# Patient Record
Sex: Male | Born: 1945 | Race: White | Hispanic: No | Marital: Married | Smoking: Former smoker
Health system: Southern US, Community
[De-identification: ages and names within clinical notes are randomized; demographics above are authoritative.]

## PROBLEM LIST (undated history)

## (undated) DIAGNOSIS — G7000 Myasthenia gravis without (acute) exacerbation: Secondary | ICD-10-CM

## (undated) DIAGNOSIS — E782 Mixed hyperlipidemia: Secondary | ICD-10-CM

## (undated) DIAGNOSIS — K219 Gastro-esophageal reflux disease without esophagitis: Secondary | ICD-10-CM

## (undated) DIAGNOSIS — I10 Essential (primary) hypertension: Secondary | ICD-10-CM

## (undated) DIAGNOSIS — C4490 Unspecified malignant neoplasm of skin, unspecified: Secondary | ICD-10-CM

## (undated) DIAGNOSIS — C649 Malignant neoplasm of unspecified kidney, except renal pelvis: Secondary | ICD-10-CM

## (undated) DIAGNOSIS — N529 Male erectile dysfunction, unspecified: Secondary | ICD-10-CM

## (undated) DIAGNOSIS — E119 Type 2 diabetes mellitus without complications: Secondary | ICD-10-CM

## (undated) HISTORY — PX: THYROIDECTOMY: SHX17

## (undated) HISTORY — PX: APPENDECTOMY: SHX54

## (undated) HISTORY — PX: NEPHRECTOMY: SHX65

## (undated) HISTORY — PX: PANCREAS SURGERY: SHX731

## (undated) HISTORY — PX: CHOLECYSTECTOMY: SHX55

## (undated) HISTORY — PX: SKIN CANCER EXCISION: SHX779

---

## 1997-09-20 ENCOUNTER — Emergency Department (HOSPITAL_COMMUNITY): Admission: EM | Admit: 1997-09-20 | Discharge: 1997-09-20 | Payer: Self-pay | Admitting: Emergency Medicine

## 1997-09-25 ENCOUNTER — Emergency Department (HOSPITAL_COMMUNITY): Admission: EM | Admit: 1997-09-25 | Discharge: 1997-09-25 | Payer: Self-pay | Admitting: Emergency Medicine

## 1998-11-20 ENCOUNTER — Encounter: Payer: Self-pay | Admitting: Orthopedic Surgery

## 1998-11-26 ENCOUNTER — Ambulatory Visit (HOSPITAL_COMMUNITY): Admission: RE | Admit: 1998-11-26 | Discharge: 1998-11-26 | Payer: Self-pay | Admitting: Orthopedic Surgery

## 1999-02-08 ENCOUNTER — Ambulatory Visit (HOSPITAL_COMMUNITY): Admission: RE | Admit: 1999-02-08 | Discharge: 1999-02-08 | Payer: Self-pay | Admitting: Orthopedic Surgery

## 1999-02-08 ENCOUNTER — Encounter: Payer: Self-pay | Admitting: Orthopedic Surgery

## 1999-05-14 ENCOUNTER — Ambulatory Visit (HOSPITAL_COMMUNITY): Admission: RE | Admit: 1999-05-14 | Discharge: 1999-05-14 | Payer: Self-pay

## 2000-07-23 ENCOUNTER — Encounter: Payer: Self-pay | Admitting: *Deleted

## 2000-07-23 ENCOUNTER — Inpatient Hospital Stay (HOSPITAL_COMMUNITY): Admission: EM | Admit: 2000-07-23 | Discharge: 2000-07-24 | Payer: Self-pay | Admitting: *Deleted

## 2003-10-20 ENCOUNTER — Observation Stay (HOSPITAL_COMMUNITY): Admission: EM | Admit: 2003-10-20 | Discharge: 2003-10-21 | Payer: Self-pay | Admitting: Emergency Medicine

## 2005-09-24 ENCOUNTER — Ambulatory Visit (HOSPITAL_BASED_OUTPATIENT_CLINIC_OR_DEPARTMENT_OTHER): Admission: RE | Admit: 2005-09-24 | Discharge: 2005-09-24 | Payer: Self-pay | Admitting: Orthopedic Surgery

## 2014-07-20 ENCOUNTER — Encounter (HOSPITAL_COMMUNITY): Payer: Self-pay | Admitting: Emergency Medicine

## 2014-07-20 ENCOUNTER — Emergency Department (HOSPITAL_COMMUNITY): Payer: PPO

## 2014-07-20 ENCOUNTER — Inpatient Hospital Stay (HOSPITAL_COMMUNITY)
Admission: EM | Admit: 2014-07-20 | Discharge: 2014-07-21 | DRG: 313 | Disposition: A | Discharge status: Home / Self Care | Payer: PPO | Attending: Internal Medicine | Admitting: Internal Medicine

## 2014-07-20 DIAGNOSIS — Z905 Acquired absence of kidney: Secondary | ICD-10-CM | POA: Diagnosis present

## 2014-07-20 DIAGNOSIS — R079 Chest pain, unspecified: Principal | ICD-10-CM | POA: Diagnosis present

## 2014-07-20 DIAGNOSIS — K219 Gastro-esophageal reflux disease without esophagitis: Secondary | ICD-10-CM | POA: Diagnosis present

## 2014-07-20 DIAGNOSIS — K2100 Gastro-esophageal reflux disease with esophagitis, without bleeding: Secondary | ICD-10-CM

## 2014-07-20 DIAGNOSIS — E119 Type 2 diabetes mellitus without complications: Secondary | ICD-10-CM | POA: Diagnosis present

## 2014-07-20 DIAGNOSIS — I129 Hypertensive chronic kidney disease with stage 1 through stage 4 chronic kidney disease, or unspecified chronic kidney disease: Secondary | ICD-10-CM | POA: Diagnosis present

## 2014-07-20 DIAGNOSIS — Z85528 Personal history of other malignant neoplasm of kidney: Secondary | ICD-10-CM | POA: Diagnosis not present

## 2014-07-20 DIAGNOSIS — I10 Essential (primary) hypertension: Secondary | ICD-10-CM | POA: Diagnosis not present

## 2014-07-20 DIAGNOSIS — R0789 Other chest pain: Secondary | ICD-10-CM | POA: Diagnosis not present

## 2014-07-20 DIAGNOSIS — E782 Mixed hyperlipidemia: Secondary | ICD-10-CM | POA: Diagnosis present

## 2014-07-20 DIAGNOSIS — N182 Chronic kidney disease, stage 2 (mild): Secondary | ICD-10-CM | POA: Diagnosis present

## 2014-07-20 DIAGNOSIS — M1990 Unspecified osteoarthritis, unspecified site: Secondary | ICD-10-CM | POA: Diagnosis present

## 2014-07-20 DIAGNOSIS — Z85828 Personal history of other malignant neoplasm of skin: Secondary | ICD-10-CM

## 2014-07-20 DIAGNOSIS — N179 Acute kidney failure, unspecified: Secondary | ICD-10-CM | POA: Diagnosis present

## 2014-07-20 DIAGNOSIS — Z87891 Personal history of nicotine dependence: Secondary | ICD-10-CM

## 2014-07-20 DIAGNOSIS — Z9049 Acquired absence of other specified parts of digestive tract: Secondary | ICD-10-CM | POA: Diagnosis present

## 2014-07-20 HISTORY — DX: Male erectile dysfunction, unspecified: N52.9

## 2014-07-20 HISTORY — DX: Unspecified malignant neoplasm of skin, unspecified: C44.90

## 2014-07-20 HISTORY — DX: Essential (primary) hypertension: I10

## 2014-07-20 HISTORY — DX: Gastro-esophageal reflux disease without esophagitis: K21.9

## 2014-07-20 HISTORY — DX: Malignant neoplasm of unspecified kidney, except renal pelvis: C64.9

## 2014-07-20 HISTORY — DX: Mixed hyperlipidemia: E78.2

## 2014-07-20 HISTORY — DX: Type 2 diabetes mellitus without complications: E11.9

## 2014-07-20 LAB — CBC WITH DIFFERENTIAL/PLATELET
BASOS ABS: 0.1 10*3/uL (ref 0.0–0.1)
BASOS PCT: 1 % (ref 0–1)
EOS ABS: 0.4 10*3/uL (ref 0.0–0.7)
Eosinophils Relative: 5 % (ref 0–5)
HCT: 40.1 % (ref 39.0–52.0)
Hemoglobin: 13.8 g/dL (ref 13.0–17.0)
LYMPHS ABS: 1.5 10*3/uL (ref 0.7–4.0)
Lymphocytes Relative: 24 % (ref 12–46)
MCH: 28.2 pg (ref 26.0–34.0)
MCHC: 34.4 g/dL (ref 30.0–36.0)
MCV: 82 fL (ref 78.0–100.0)
MONO ABS: 0.6 10*3/uL (ref 0.1–1.0)
MONOS PCT: 8 % (ref 3–12)
NEUTROS ABS: 4.1 10*3/uL (ref 1.7–7.7)
Neutrophils Relative %: 62 % (ref 43–77)
Platelets: 214 10*3/uL (ref 150–400)
RBC: 4.89 MIL/uL (ref 4.22–5.81)
RDW: 13.5 % (ref 11.5–15.5)
WBC: 6.5 10*3/uL (ref 4.0–10.5)

## 2014-07-20 LAB — COMPREHENSIVE METABOLIC PANEL
ALBUMIN: 4.4 g/dL (ref 3.5–5.0)
ALT: 29 U/L (ref 17–63)
AST: 32 U/L (ref 15–41)
Alkaline Phosphatase: 68 U/L (ref 38–126)
Anion gap: 12 (ref 5–15)
BILIRUBIN TOTAL: 0.5 mg/dL (ref 0.3–1.2)
BUN: 22 mg/dL — ABNORMAL HIGH (ref 6–20)
CHLORIDE: 103 mmol/L (ref 101–111)
CO2: 23 mmol/L (ref 22–32)
CREATININE: 1.39 mg/dL — AB (ref 0.61–1.24)
Calcium: 10 mg/dL (ref 8.9–10.3)
GFR calc Af Amer: 59 mL/min — ABNORMAL LOW (ref >60)
GFR calc non Af Amer: 51 mL/min — ABNORMAL LOW (ref >60)
GLUCOSE: 150 mg/dL — AB (ref 65–99)
Potassium: 4.3 mmol/L (ref 3.5–5.1)
Sodium: 138 mmol/L (ref 135–145)
TOTAL PROTEIN: 7.2 g/dL (ref 6.5–8.1)

## 2014-07-20 LAB — GLUCOSE, CAPILLARY: Glucose-Capillary: 128 mg/dL — ABNORMAL HIGH (ref 65–99)

## 2014-07-20 LAB — TROPONIN I
Troponin I: <0.03 ng/mL (ref <0.031)
Troponin I: <0.03 ng/mL (ref <0.031)

## 2014-07-20 LAB — LIPASE, BLOOD: Lipase: 31 U/L (ref 22–51)

## 2014-07-20 MED ORDER — ZOLPIDEM TARTRATE 5 MG PO TABS: 5.0000 mg | ORAL_TABLET | Freq: Every evening | ORAL | Status: DC | PRN

## 2014-07-20 MED ORDER — SODIUM CHLORIDE 0.9 % IJ SOLN
3.0000 mL | Freq: Two times a day (BID) | INTRAMUSCULAR | Status: DC
  Administered 2014-07-20: 3 mL via INTRAVENOUS

## 2014-07-20 MED ORDER — ASPIRIN EC 325 MG PO TBEC
325.0000 mg | DELAYED_RELEASE_TABLET | Freq: Every day | ORAL | Status: DC
  Administered 2014-07-21: 325 mg via ORAL
  Filled 2014-07-20: qty 1

## 2014-07-20 MED ORDER — ALUM & MAG HYDROXIDE-SIMETH 200-200-20 MG/5ML PO SUSP: 30.0000 mL | Freq: Four times a day (QID) | ORAL | Status: DC | PRN

## 2014-07-20 MED ORDER — PANTOPRAZOLE SODIUM 40 MG PO TBEC
40.0000 mg | DELAYED_RELEASE_TABLET | Freq: Every day | ORAL | Status: DC
  Administered 2014-07-21: 40 mg via ORAL
  Filled 2014-07-20 (×2): qty 1

## 2014-07-20 MED ORDER — INSULIN ASPART 100 UNIT/ML ~~LOC~~ SOLN: 0.0000 [IU] | Freq: Three times a day (TID) | SUBCUTANEOUS | Status: DC

## 2014-07-20 MED ORDER — SODIUM CHLORIDE 0.9 % IV SOLN
INTRAVENOUS | Status: DC
  Administered 2014-07-20: 23:00:00 via INTRAVENOUS
  Administered 2014-07-21: 100 mL/h via INTRAVENOUS

## 2014-07-20 MED ORDER — HEPARIN SODIUM (PORCINE) 5000 UNIT/ML IJ SOLN
5000.0000 [IU] | Freq: Three times a day (TID) | INTRAMUSCULAR | Status: DC
  Filled 2014-07-20 (×3): qty 1

## 2014-07-20 MED ORDER — NITROGLYCERIN 0.4 MG SL SUBL: 0.4000 mg | SUBLINGUAL_TABLET | SUBLINGUAL | Status: DC | PRN

## 2014-07-20 MED ORDER — MORPHINE SULFATE 2 MG/ML IJ SOLN: 2.0000 mg | INTRAMUSCULAR | Status: DC | PRN

## 2014-07-20 MED ORDER — ACETAMINOPHEN 325 MG PO TABS: 650.0000 mg | ORAL_TABLET | Freq: Four times a day (QID) | ORAL | Status: DC | PRN

## 2014-07-20 MED ORDER — ONDANSETRON HCL 4 MG PO TABS: 4.0000 mg | ORAL_TABLET | Freq: Four times a day (QID) | ORAL | Status: DC | PRN

## 2014-07-20 MED ORDER — ONDANSETRON HCL 4 MG/2ML IJ SOLN: 4.0000 mg | Freq: Four times a day (QID) | INTRAMUSCULAR | Status: DC | PRN

## 2014-07-20 MED ORDER — ASPIRIN 325 MG PO TABS: 325.0000 mg | ORAL_TABLET | Freq: Every day | ORAL | Status: DC

## 2014-07-20 MED ORDER — ATENOLOL 12.5 MG HALF TABLET
12.5000 mg | ORAL_TABLET | Freq: Two times a day (BID) | ORAL | Status: DC
  Administered 2014-07-20 – 2014-07-21 (×2): 12.5 mg via ORAL
  Filled 2014-07-20 (×3): qty 1

## 2014-07-20 MED ORDER — ATORVASTATIN CALCIUM 40 MG PO TABS
40.0000 mg | ORAL_TABLET | Freq: Every day | ORAL | Status: DC
  Filled 2014-07-20: qty 1

## 2014-07-20 MED ORDER — ACETAMINOPHEN 650 MG RE SUPP: 650.0000 mg | Freq: Four times a day (QID) | RECTAL | Status: DC | PRN

## 2014-07-20 NOTE — ED Notes (Signed)
Pt. Complaint of CP starting last PM. Central chest pressure, no radiation, some diaphoresis. Pt. States he took two tums and was able to sleep. Pt. Had 1/10 pain this AM. Went to [facility]. Pt. Had 325 ASA and 1 Nitro today. Denies CP/SOB at this time. Pt. States he has had increased weakness x the last couple months.

## 2014-07-20 NOTE — H&P (Signed)
[facility] History and Physical  Kevin Hawkins LKG:[id number] DOB: Apr 13, 1945 DOA: 07/20/2014  Referring physician: ED physician PCP: Leonides Sake, MD  Specialists:   Chief Complaint: Chest pain  HPI: Kevin Hawkins is a 69 y.o. male with PMH of hypertension, hyperlipidemia, diabetes mellitus, GERD, left kidney cancer (post status of nephrectomy 6 years ago, no chemotherapy or radiation therapy), who presents with chest pain.  Patient reports that he had sudden onset chest pain at about 7 PM. It was located in the substernal area, sharp, 10 out of 10 in severity, nonradiating. It was not aggravated by deep breath. It was associated nausea, diaphoresis, shortness of breath. It lasted for about 30 min and resolved spontaneously. Patient was seen at urgent care and EKG obtained. They were concerned for possible ST elevations, however there is no evidence of ST elevation on the urgent care EKG per ED. When I saw patient in Ed, he is chest pain free. He states that sometimes he has mild cough, no SOB.   Currently patient denies fever, chills, fatigue, running nose, ear pain, headaches, chest pain, SOB, abdominal pain, diarrhea, constipation, dysuria, urgency, frequency, hematuria, skin rashes, joint pain or leg swelling. No unilateral weakness, numbness or tingling sensations. No vision change or hearing loss.  In ED, patient was found to have negative troponin, temperature normal, no tachycardia, WBC 6.5, electrolytes okay, lipase 31, negative chest x-ray. EKG showed occasional PVC, no ischemic change. Patient isn't admitted to inpatient for further evaluation and treatment.  Where does patient live?   At home   Can patient participate in ADLs?  Yes    Review of Systems:   General: no fevers, chills, no changes in body weight, normal appetite, no fatigue HEENT: no blurry vision, hearing changes or sore throat Pulm: no dyspnea, has mild coughing sometimes, wheezing CV: no chest  pain, palpitations Abd: no nausea, vomiting, abdominal pain, diarrhea, constipation GU: no dysuria, burning on urination, increased urinary frequency, hematuria  Ext: no leg edema Neuro: no unilateral weakness, numbness, or tingling, no vision change or hearing loss Skin: no rash MSK: No muscle spasm, no deformity, no limitation of range of movement in spin Heme: No easy bruising.  Travel history: No recent long distant travel.  Allergy: No Known Allergies  Past Medical History  Diagnosis Date  . Hypertension   . GERD (gastroesophageal reflux disease)   . Hyperlipidemia, mixed   . ED (erectile dysfunction)   . Tobacco abuse   . Type II diabetes mellitus   . Arthritis     "all over"  . Skin cancer     under right eye"  . Renal cancer     S/P left nephrectomy; no chemo or radiation    Past Surgical History  Procedure Laterality Date  . Cholecystectomy    . Appendectomy    . Nephrectomy Left ~ 2010  . Pancreas surgery      "I've got 2/3 of it left"  . Skin cancer excision Right     "under eye"    Social History:  reports that he has quit smoking. His smoking use included Cigarettes. He has a 40 pack-year smoking history. He has never used smokeless tobacco. He reports that he drinks alcohol. He reports that he does not use illicit drugs.  Family History:  Family History  Problem Relation Age of Onset  . Hypertension Mother   . Heart attack Mother   . Cancer Father     Possible bone cancer  .  Diabetes Sister   . Prostate cancer Brother      Prior to Admission medications   Not on File    Physical Exam: Filed Vitals:   07/20/14 2045 07/20/14 2100 07/20/14 2115 07/20/14 2213  BP: 134/64 144/73 141/61 126/68  Pulse: 75 75 72 71  Temp:    97.9 F (36.6 C)  TempSrc:    Oral  Resp: '22 20 18 18  '$ Height:    6' (1.829 m)  Weight:    75.4 kg (166 lb 3.6 oz)  SpO2: 97% 97% 96% 98%   General: Not in acute distress HEENT:       Eyes: PERRL, EOMI, no scleral  icterus.       ENT: No discharge from the ears and nose, no pharynx injection, no tonsillar enlargement.        Neck: No JVD, no bruit, no mass felt. Heme: No neck lymph node enlargement. Cardiac: S1/S2, RRR, No murmurs, No gallops or rubs. Pulm: Good air movement bilaterally. No rales, wheezing, rhonchi or rubs. Abd: Soft, nondistended, nontender, no rebound pain, no organomegaly, BS present. Ext: No pitting leg edema bilaterally. 2+DP/PT pulse bilaterally. Musculoskeletal: No joint deformities, No joint redness or warmth, no limitation of ROM in spin. Skin: No rashes.  Neuro: Alert, oriented X3, cranial nerves II-XII grossly intact, muscle strength 5/5 in all extremities, sensation to light touch intact.  Psych: Patient is not psychotic, no suicidal or hemocidal ideation.  Labs on Admission:  Basic Metabolic Panel:  Recent Labs Lab 07/20/14 1754  NA 138  K 4.3  CL 103  CO2 23  GLUCOSE 150*  BUN 22*  CREATININE 1.39*  CALCIUM 10.0   Liver Function Tests:  Recent Labs Lab 07/20/14 1754  AST 32  ALT 29  ALKPHOS 68  BILITOT 0.5  PROT 7.2  ALBUMIN 4.4    Recent Labs Lab 07/20/14 1754  LIPASE 31   No results for input(s): AMMONIA in the last 168 hours. CBC:  Recent Labs Lab 07/20/14 1754  WBC 6.5  NEUTROABS 4.1  HGB 13.8  HCT 40.1  MCV 82.0  PLT 214   Cardiac Enzymes:  Recent Labs Lab 07/20/14 1754 07/20/14 2225  TROPONINI <0.03 <0.03    BNP (last 3 results) No results for input(s): BNP in the last 8760 hours.  ProBNP (last 3 results) No results for input(s): PROBNP in the last 8760 hours.  CBG:  Recent Labs Lab 07/20/14 2301  GLUCAP 128*    Radiological Exams on Admission: Dg Chest 2 View  07/20/2014   CLINICAL DATA:  Chest pain.  Cough.  EXAM: CHEST  2 VIEW  COMPARISON:  05/31/2014  FINDINGS: Heart size and pulmonary vascularity are normal. No infiltrates or effusions. Chronic slight pleural thickening laterally at the lung bases.  No  acute osseous abnormality.  Previous right rotator cuff surgery.  IMPRESSION: No acute abnormality.   Electronically Signed   By: Lorriane Shire M.D.   On: 07/20/2014 17:37    EKG: Independently reviewed.  Abnormal findings: PVC   Assessment/Plan Principal Problem:   Chest pain Active Problems:   Hypertension   GERD (gastroesophageal reflux disease)   Hyperlipidemia, mixed   Diabetes mellitus without complication   AKI (acute kidney injury)  Chest pain: Atypical chest pain. Currently chest pain-free. No pneumonia by x-ray. Less likely to have pulmonary embolism given no oxygen desaturation, SOB and low Well's score.  - will admit to Tele bed  - cycle CE q6 x3 and repeat her  EKG in the am  - prn Nitroglycerin, Morphine, and aspirin, lipitor, atenolol - Risk factor stratification: will check FLP and A1C, UDS - Consider cardiology consult if test positive for CEs  - 2d echo  GERD: -Protonix  Hypertension: -Atenolol and amlodipine -hold cozaar due to AKI  HLD: Hyperlipidemia, mixed. No LDL record. -Lipitor and fenofibrate -FLP  DM-II: No A1c on recored. Patient is taking metformin at home -SSI -Check A1c  AKI: Cre=1.39. Bun=22. No record for previous Cre.  likely due to prerenal and continuation of ARB -IVF:100 cc/h -Check FeNa -US-renal -Follow up renal function by BMP -Hold cozaar   DVT ppx: SQ Heparin     Code Status: Full code Family Communication:   Yes, patient's  Daughter and wife     at bed side Disposition Plan: Admit to inpatient   Date of Service 07/21/2014    Ivor Costa [facility] Pager [phone]  If 7PM-7AM, please contact night-coverage [email or web address] Password [email or web address] 07/21/2014, 2:00 AM

## 2014-07-20 NOTE — ED Provider Notes (Signed)
CSN: [id number]     Arrival date & time 07/20/14  1633 History   First MD Initiated Contact with Patient 07/20/14 1645     Chief Complaint  Patient presents with  . Chest Pain     (Consider location/radiation/quality/duration/timing/severity/associated sxs/prior Treatment) Patient is a 69 y.o. male presenting with chest pain.  Chest Pain Pain location:  Substernal area Pain quality: pressure   Pain radiates to:  Does not radiate Pain radiates to the back: no   Pain severity:  Severe Onset quality:  Sudden (this occurred yesterday at approximately in 1900 in the evening. Patient reported that  after the pain had subsided he was able to follow asleep. This morning when he awoke he still had mild pain and was urged by his family to be evaluated.) Duration:  30 minutes Timing:  Rare Progression:  Resolved Chronicity:  New Context: at rest   Context: not breathing, no drug use, not eating, no intercourse, not lifting, no movement, not raising an arm, no stress and no trauma   Relieved by:  Nitroglycerin Worsened by:  Nothing tried Associated symptoms: diaphoresis, fatigue, nausea and shortness of breath   Associated symptoms: no abdominal pain, no altered mental status, no anorexia, no anxiety, no back pain, no claudication, no cough, no dizziness, no fever, no headache, no heartburn, no lower extremity edema, no numbness, no orthopnea, no palpitations, no PND, no syncope, not vomiting and no weakness   Risk factors: diabetes mellitus, high cholesterol, hypertension and male sex   Risk factors: no smoking     Past Medical History  Diagnosis Date  . Hypertension   . GERD (gastroesophageal reflux disease)   . Hyperlipidemia, mixed   . ED (erectile dysfunction)   . Tobacco abuse   . Type II diabetes mellitus   . Arthritis     "all over"  . Skin cancer     under right eye"  . Renal cancer     S/P left nephrectomy; no chemo or radiation   Past Surgical History  Procedure  Laterality Date  . Cholecystectomy    . Appendectomy    . Nephrectomy Left ~ 2010  . Pancreas surgery      "I've got 2/3 of it left"  . Skin cancer excision Right     "under eye"   History reviewed. No pertinent family history. History  Substance Use Topics  . Smoking status: Former Smoker -- 2.00 packs/day for 20 years    Types: Cigarettes  . Smokeless tobacco: Never Used     Comment: "stopped smoking cigarettes in the early 1970's"  . Alcohol Use: Yes     Comment: "stopped drinking in the early 1970's"    Review of Systems  Constitutional: Positive for diaphoresis and fatigue. Negative for fever, chills and appetite change.  HENT: Negative for congestion, ear pain, facial swelling, mouth sores and sore throat.   Eyes: Negative for visual disturbance.  Respiratory: Positive for shortness of breath. Negative for cough and chest tightness.   Cardiovascular: Positive for chest pain. Negative for palpitations, orthopnea, claudication, syncope and PND.  Gastrointestinal: Positive for nausea. Negative for heartburn, vomiting, abdominal pain, diarrhea, blood in stool and anorexia.  Endocrine: Negative for cold intolerance and heat intolerance.  Genitourinary: Negative for frequency, decreased urine volume and difficulty urinating.  Musculoskeletal: Negative for back pain and neck stiffness.  Skin: Negative for rash.  Neurological: Negative for dizziness, weakness, light-headedness, numbness and headaches.  All other systems reviewed and are negative.  Allergies  Review of patient's allergies indicates no known allergies.  Home Medications   Prior to Admission medications   Medication Sig Start Date End Date Taking? Authorizing Provider  amLODipine (NORVASC) 10 MG tablet Take 10 mg by mouth daily.   Yes Historical Provider, MD  aspirin 325 MG tablet Take 325 mg by mouth daily.   Yes Historical Provider, MD  fenofibrate micronized (LOFIBRA) 134 MG capsule Take 134 mg by mouth  daily before breakfast.   Yes Historical Provider, MD  losartan (COZAAR) 100 MG tablet Take 100 mg by mouth daily.   Yes Historical Provider, MD  lovastatin (MEVACOR) 20 MG tablet Take 20 mg by mouth at bedtime.   Yes Historical Provider, MD  metFORMIN (GLUCOPHAGE-XR) 500 MG 24 hr tablet Take 500-1,000 mg by mouth 2 (two) times daily. Take 1000 mg in the morning and 500 in the evening at bedtime   Yes Historical Provider, MD  omeprazole (PRILOSEC) 40 MG capsule Take 40 mg by mouth daily.   Yes Historical Provider, MD   BP 126/68 mmHg  Pulse 71  Temp(Src) 97.9 F (36.6 C) (Oral)  Resp 18  Ht 6' (1.829 m)  Wt 166 lb 3.6 oz (75.4 kg)  BMI 22.54 kg/m2  SpO2 98% Physical Exam  Constitutional: He is oriented to person, place, and time. He appears well-nourished. No distress.  HENT:  Head: Normocephalic and atraumatic.  Right Ear: External ear normal.  Left Ear: External ear normal.  Eyes: Pupils are equal, round, and reactive to light. Right eye exhibits no discharge. Left eye exhibits no discharge. No scleral icterus.  Neck: Normal range of motion. Neck supple.  Cardiovascular: Normal rate.  Exam reveals no gallop and no friction rub.   No murmur heard. Pulmonary/Chest: Effort normal and breath sounds normal. No stridor. No respiratory distress. He has no wheezes. He has no rales. He exhibits no tenderness.  Abdominal: Soft. He exhibits no distension and no mass. There is no tenderness. There is no rebound and no guarding.  Musculoskeletal: He exhibits no edema or tenderness.  Neurological: He is alert and oriented to person, place, and time.  Skin: Skin is warm and dry. No rash noted. He is not diaphoretic. No erythema.    ED Course  Procedures (including critical care time) Labs Review Labs Reviewed  COMPREHENSIVE METABOLIC PANEL - Abnormal; Notable for the following:    Glucose, Bld 150 (*)    BUN 22 (*)    Creatinine, Ser 1.39 (*)    GFR calc non Af Amer 51 (*)    GFR calc Af  Amer 59 (*)    All other components within normal limits  GLUCOSE, CAPILLARY - Abnormal; Notable for the following:    Glucose-Capillary 128 (*)    All other components within normal limits  CBC WITH DIFFERENTIAL/PLATELET  TROPONIN I  LIPASE, BLOOD  TROPONIN I  URINE RAPID DRUG SCREEN (HOSP PERFORMED) NOT AT [facility]  URINE RAPID DRUG SCREEN (HOSP PERFORMED) NOT AT [facility]  HIV ANTIBODY (ROUTINE TESTING)  TROPONIN I  TROPONIN I  HEMOGLOBIN A1C  PROTIME-INR  APTT  BASIC METABOLIC PANEL  CBC  LIPID PANEL    Imaging Review Dg Chest 2 View  07/20/2014   CLINICAL DATA:  Chest pain.  Cough.  EXAM: CHEST  2 VIEW  COMPARISON:  05/31/2014  FINDINGS: Heart size and pulmonary vascularity are normal. No infiltrates or effusions. Chronic slight pleural thickening laterally at the lung bases.  No acute osseous abnormality.  Previous right  rotator cuff surgery.  IMPRESSION: No acute abnormality.   Electronically Signed   By: Lorriane Shire M.D.   On: 07/20/2014 17:37     EKG Interpretation   Date/Time:  Friday July 20 2014 16:36:57 EDT Ventricular Rate:  72 PR Interval:  153 QRS Duration: 103 QT Interval:  418 QTC Calculation: 457 R Axis:   -38 Text Interpretation:  Sinus arrhythmia Ventricular premature complex Left  axis deviation RSR' in V1 or V2, probably normal variant No significant  change was found Confirmed by Wyvonnia Dusky  MD, STEPHEN [phone]) on 07/20/2014  4:47:19 PM      MDM   Final diagnoses:  Chest pain, unspecified chest pain type      69 year old gentleman with a past medical history of hypertension, diabetes, hyperlipidemia, and renal cancer who presents to the ED with chest pain onset last night around 1900 last approximately 30 minutes. Patient described the pain as pressure like non-radiating and did have associated nausea, diaphoresis, shortness of breath. Patient seen at urgent care and EKG obtained. They were concerned for possible ST elevations however on my assessment  there is no evidence of ST elevation on the urgent care EKG. EMS was called to the urgent care for transportation to the ED. She has remained hemodynamically stable and round. On arrival the patient's was hemodynamically stable, alert and oriented 3. Patient was given 324 of aspirin this morning and nitroglycerin by EMS. Patient is currently pain-free. EKG here revealed sinus arrhythmia with incomplete right bundle branch block. No evidence of acute ischemia. Chest x-ray with no evidence of pneumonia, pneumothorax, pneumomediastinum, pleural effusion, or widened mediastinum (to suggest dissection). There is low clinical suspicion for pulmonary embolism. Doubt esophageal rupture. Patient with a HEAR score of 6. Initial troponin negative. Patient will be admitted to medicine service for ACS rule out.  Patient seen in conjunction with Dr. Wyvonnia Dusky.  Sibyl Parr, MD. Resident  Addison Lank, MD 07/21/14 0104  Addison Lank, MD 07/21/14 6045  Ezequiel Essex, MD 07/21/14 [phone]

## 2014-07-21 ENCOUNTER — Encounter (HOSPITAL_COMMUNITY): Payer: Self-pay | Admitting: Internal Medicine

## 2014-07-21 ENCOUNTER — Observation Stay (HOSPITAL_COMMUNITY): Payer: PPO

## 2014-07-21 DIAGNOSIS — R0789 Other chest pain: Secondary | ICD-10-CM

## 2014-07-21 DIAGNOSIS — R079 Chest pain, unspecified: Secondary | ICD-10-CM

## 2014-07-21 DIAGNOSIS — N179 Acute kidney failure, unspecified: Secondary | ICD-10-CM | POA: Diagnosis present

## 2014-07-21 DIAGNOSIS — N182 Chronic kidney disease, stage 2 (mild): Secondary | ICD-10-CM

## 2014-07-21 DIAGNOSIS — E119 Type 2 diabetes mellitus without complications: Secondary | ICD-10-CM

## 2014-07-21 LAB — TROPONIN I
Troponin I: <0.03 ng/mL (ref <0.031)
Troponin I: <0.03 ng/mL (ref <0.031)

## 2014-07-21 LAB — PROTIME-INR
INR: 1.18 (ref 0.00–1.49)
PROTHROMBIN TIME: 15.2 s (ref 11.6–15.2)

## 2014-07-21 LAB — RAPID URINE DRUG SCREEN, HOSP PERFORMED
Amphetamines: NOT DETECTED
BARBITURATES: NOT DETECTED
Benzodiazepines: NOT DETECTED
Cocaine: NOT DETECTED
Opiates: NOT DETECTED
Tetrahydrocannabinol: NOT DETECTED

## 2014-07-21 LAB — NM MYOCAR MULTI W/SPECT W/WALL MOTION / EF
CHL CUP NUCLEAR SSS: 3
LV dias vol: 102 mL
LVSYSVOL: 50 mL
Nuc Stress EF: 50 %
RATE: 0.34
SDS: 0
SRS: 3
TID: 1.22

## 2014-07-21 LAB — CBC
HCT: 38.5 % — ABNORMAL LOW (ref 39.0–52.0)
Hemoglobin: 13.2 g/dL (ref 13.0–17.0)
MCH: 28.3 pg (ref 26.0–34.0)
MCHC: 34.3 g/dL (ref 30.0–36.0)
MCV: 82.6 fL (ref 78.0–100.0)
Platelets: 204 10*3/uL (ref 150–400)
RBC: 4.66 MIL/uL (ref 4.22–5.81)
RDW: 13.7 % (ref 11.5–15.5)
WBC: 6.3 10*3/uL (ref 4.0–10.5)

## 2014-07-21 LAB — GLUCOSE, CAPILLARY
GLUCOSE-CAPILLARY: 126 mg/dL — AB (ref 65–99)
GLUCOSE-CAPILLARY: 119 mg/dL — AB (ref 65–99)
GLUCOSE-CAPILLARY: 323 mg/dL — AB (ref 65–99)

## 2014-07-21 LAB — SODIUM, URINE, RANDOM: SODIUM UR: 123 mmol/L

## 2014-07-21 LAB — LIPID PANEL
CHOL/HDL RATIO: 4 ratio
Cholesterol: 169 mg/dL (ref 0–200)
HDL: 42 mg/dL (ref >40)
LDL CALC: 98 mg/dL (ref 0–99)
Triglycerides: 144 mg/dL (ref <150)
VLDL: 29 mg/dL (ref 0–40)

## 2014-07-21 LAB — BASIC METABOLIC PANEL
ANION GAP: 8 (ref 5–15)
BUN: 20 mg/dL (ref 6–20)
CO2: 24 mmol/L (ref 22–32)
CREATININE: 1.28 mg/dL — AB (ref 0.61–1.24)
Calcium: 9.2 mg/dL (ref 8.9–10.3)
Chloride: 105 mmol/L (ref 101–111)
GFR, EST NON AFRICAN AMERICAN: 56 mL/min — AB (ref >60)
Glucose, Bld: 150 mg/dL — ABNORMAL HIGH (ref 65–99)
Potassium: 4.1 mmol/L (ref 3.5–5.1)
Sodium: 137 mmol/L (ref 135–145)

## 2014-07-21 LAB — HIV ANTIBODY (ROUTINE TESTING W REFLEX): HIV SCREEN 4TH GENERATION: NONREACTIVE

## 2014-07-21 LAB — CREATININE, URINE, RANDOM: Creatinine, Urine: 150.54 mg/dL

## 2014-07-21 LAB — APTT: aPTT: 25 seconds (ref 24–37)

## 2014-07-21 MED ORDER — ATORVASTATIN CALCIUM 40 MG PO TABS: 40.0000 mg | ORAL_TABLET | Freq: Every day | ORAL | Status: DC

## 2014-07-21 MED ORDER — NITROGLYCERIN 0.4 MG SL SUBL: 0.4000 mg | SUBLINGUAL_TABLET | SUBLINGUAL | Status: AC | PRN

## 2014-07-21 MED ORDER — REGADENOSON 0.4 MG/5ML IV SOLN
INTRAVENOUS | Status: AC
  Administered 2014-07-21: 0.4 mg
  Filled 2014-07-21: qty 5

## 2014-07-21 MED ORDER — AMLODIPINE BESYLATE 5 MG PO TABS
5.0000 mg | ORAL_TABLET | Freq: Every day | ORAL | Status: DC
  Administered 2014-07-21: 5 mg via ORAL
  Filled 2014-07-21: qty 1

## 2014-07-21 MED ORDER — FENOFIBRATE 160 MG PO TABS
160.0000 mg | ORAL_TABLET | Freq: Every day | ORAL | Status: DC
  Administered 2014-07-21: 160 mg via ORAL
  Filled 2014-07-21: qty 1

## 2014-07-21 MED ORDER — TECHNETIUM TC 99M SESTAMIBI - CARDIOLITE
30.0000 | Freq: Once | INTRAVENOUS | Status: AC | PRN
  Administered 2014-07-21: 30 via INTRAVENOUS

## 2014-07-21 MED ORDER — TECHNETIUM TC 99M SESTAMIBI GENERIC - CARDIOLITE
10.0000 | Freq: Once | INTRAVENOUS | Status: AC | PRN
  Administered 2014-07-21: 10 via INTRAVENOUS

## 2014-07-21 NOTE — Progress Notes (Signed)
Echocardiogram 2D Echocardiogram has been performed.  Tresa Res 07/21/2014, 2:41 PM

## 2014-07-21 NOTE — Discharge Instructions (Signed)
Follow with HAMRICK,MAURA L, MD in 2-4 weeks  Please get a complete blood count and chemistry panel checked by your Primary MD at your next visit, and again as instructed by your Primary MD. Please get your medications reviewed and adjusted by your Primary MD.  Please request your Primary MD to go over all Hospital Tests and Procedure/Radiological results at the follow up, please get all Hospital records sent to your Prim MD by signing hospital release before you go home.  If you had Pneumonia of Lung problems at the Hospital: Please get a 2 view Chest X ray done in 6-8 weeks after hospital discharge or sooner if instructed by your Primary MD.  If you have Congestive Heart Failure: Please call your Cardiologist or Primary MD anytime you have any of the following symptoms:  1) 3 pound weight gain in 24 hours or 5 pounds in 1 week  2) shortness of breath, with or without a dry hacking cough  3) swelling in the hands, feet or stomach  4) if you have to sleep on extra pillows at night in order to breathe  Follow cardiac low salt diet and 1.5 lit/day fluid restriction.  If you have diabetes Accuchecks 4 times/day, Once in AM empty stomach and then before each meal. Log in all results and show them to your primary doctor at your next visit. If any glucose reading is under 80 or above 300 call your primary MD immediately.  If you have Seizure/Convulsions/Epilepsy: Please do not drive, operate heavy machinery, participate in activities at heights or participate in high speed sports until you have seen by Primary MD or a Neurologist and advised to do so again.  If you had Gastrointestinal Bleeding: Please ask your Primary MD to check a complete blood count within one week of discharge or at your next visit. Your endoscopic/colonoscopic biopsies that are pending at the time of discharge, will also need to followed by your Primary MD.  Get Medicines reviewed and adjusted. Please take all your  medications with you for your next visit with your Primary MD  Please request your Primary MD to go over all hospital tests and procedure/radiological results at the follow up, please ask your Primary MD to get all Hospital records sent to his/her office.  If you experience worsening of your admission symptoms, develop shortness of breath, life threatening emergency, suicidal or homicidal thoughts you must seek medical attention immediately by calling 911 or calling your MD immediately  if symptoms less severe.  You must read complete instructions/literature along with all the possible adverse reactions/side effects for all the Medicines you take and that have been prescribed to you. Take any new Medicines after you have completely understood and accpet all the possible adverse reactions/side effects.   Do not drive or operate heavy machinery when taking Pain medications.   Do not take more than prescribed Pain, Sleep and Anxiety Medications  Special Instructions: If you have smoked or chewed Tobacco  in the last 2 yrs please stop smoking, stop any regular Alcohol  and or any Recreational drug use.  Wear Seat belts while driving.  Please note You were cared for by a hospitalist during your hospital stay. If you have any questions about your discharge medications or the care you received while you were in the hospital after you are discharged, you can call the unit and asked to speak with the hospitalist on call if the hospitalist that took care of you is not available. Once  you are discharged, your primary care physician will handle any further medical issues. Please note that NO REFILLS for any discharge medications will be authorized once you are discharged, as it is imperative that you return to your primary care physician (or establish a relationship with a primary care physician if you do not have one) for your aftercare needs so that they can reassess your need for medications and monitor your  lab values.  You can reach the hospitalist office at phone [phone] or fax [phone]   If you do not have a primary care physician, you can call [phone] for a physician referral.  Activity: As tolerated with Full fall precautions use walker/cane & assistance as needed  Diet: heart healthy  Disposition Home

## 2014-07-21 NOTE — Progress Notes (Signed)
PT Cancellation Note  Patient Details Name: Kevin Hawkins MRN: [id number] DOB: 19-Jul-1945   Cancelled Treatment:    Reason Eval/Treat Not Completed: Patient at procedure or test/unavailable. Will check back as schedule permits.   [name] 07/21/2014, 11:51 AM

## 2014-07-21 NOTE — Discharge Summary (Signed)
Physician Discharge Summary  Kevin Hawkins HFW:[id number] DOB: 04/10/1945 DOA: 07/20/2014  PCP: Leonides Sake, MD  Admit date: 07/20/2014 Discharge date: 07/21/2014  Time spent: > 30 minutes  Recommendations for Outpatient Follow-up:  1. Follow up with Dr. Wynonia Lawman in 1-2 weeks 2. Follow up with Dr. Lisbeth Ply in 2-4 weeks  Discharge Diagnoses:  Principal Problem:   Chest pain Active Problems:   GERD (gastroesophageal reflux disease)   Hyperlipidemia, mixed   Diabetes mellitus without complication   Diabetes mellitus type 2, noninsulin dependent   Chronic kidney disease, stage 2, mildly decreased GFR   History of kidney cancer  Discharge Condition: stable  Diet recommendation: heart healthy  Filed Weights   07/20/14 2213  Weight: 75.4 kg (166 lb 3.6 oz)   History of present illness:  Kevin Hawkins is a 69 y.o. male with PMH of hypertension, hyperlipidemia, diabetes mellitus, GERD, left kidney cancer (post status of nephrectomy 6 years ago, no chemotherapy or radiation therapy), who presents with chest pain. Patient reports that he had sudden onset chest pain at about 7 PM. It was located in the substernal area, sharp, 10 out of 10 in severity, nonradiating. It was not aggravated by deep breath. It was associated nausea, diaphoresis, shortness of breath. It lasted for about 30 min and resolved spontaneously. Patient was seen at urgent care and EKG obtained. They were concerned for possible ST elevations, however there is no evidence of ST elevation on the urgent care EKG per ED. When I saw patient in Ed, he is chest pain free. He states that sometimes he has mild cough, no SOB. Currently patient denies fever, chills, fatigue, running nose, ear pain, headaches, chest pain, SOB, abdominal pain, diarrhea, constipation, dysuria, urgency, frequency, hematuria, skin rashes, joint pain or leg swelling. No unilateral weakness, numbness or tingling sensations. No vision change or hearing  loss. In ED, patient was found to have negative troponin, temperature normal, no tachycardia, WBC 6.5, electrolytes okay, lipase 31, negative chest x-ray. EKG showed occasional PVC, no ischemic change. Patient isn't admitted to inpatient for further evaluation and treatment.  Hospital Course:  Patient was admitted to the hospital with chest pain. Dr. Wynonia Lawman with cardiology was consulted and followed patient hospitalized. He underwent a stress test which was deemed to be a low risk study (full report below). He also had a 2D echo which showed EF 50-55%, no WMA and grade 2 diastolic dysfunction. His chest pain has resolved prior to admission and he was chest pain free while here. He was discharged home in stable condition with changes to his statin from Mevacor to Lipitor and nitroglycerin was added to his regimen. He will need follow up with Dr. Wynonia Lawman in about 2 weeks as an outpatient.   Procedures:  2D echo  Study Conclusions - Left ventricle: The cavity size was normal. Systolic function wasnormal. The estimated ejection fraction was in the range of 50%to 55%. Wall motion was normal; there were no regional wallmotion abnormalities. Features are consistent with a pseudonormalleft ventricular filling pattern, with concomitant abnormalrelaxation and increased filling pressure (grade 2 diastolicdysfunction). - Left atrium: The atrium was mildly dilated.   Consultations:  Cardiology  Discharge Exam: Filed Vitals:   07/21/14 1228 07/21/14 1231 07/21/14 1233 07/21/14 1507  BP: 140/74 122/66 122/67 114/53  Pulse: 60 78 71 51  Temp:    98.2 F (36.8 C)  TempSrc:    Oral  Resp:    18  Height:      Weight:  SpO2:    98%   General: NAD Cardiovascular: RRR Respiratory: CTA biL  Discharge Instructions     Medication List    STOP taking these medications        lovastatin 20 MG tablet  Commonly known as:  MEVACOR      TAKE these medications        amLODipine 10 MG tablet    Commonly known as:  NORVASC  Take 10 mg by mouth daily.     aspirin 325 MG tablet  Take 325 mg by mouth daily.     atorvastatin 40 MG tablet  Commonly known as:  LIPITOR  Take 1 tablet (40 mg total) by mouth daily.     fenofibrate micronized 134 MG capsule  Commonly known as:  LOFIBRA  Take 134 mg by mouth daily before breakfast.     losartan 100 MG tablet  Commonly known as:  COZAAR  Take 100 mg by mouth daily.     metFORMIN 500 MG 24 hr tablet  Commonly known as:  GLUCOPHAGE-XR  Take 500-1,000 mg by mouth 2 (two) times daily. Take 1000 mg in the morning and 500 in the evening at bedtime     nitroGLYCERIN 0.4 MG SL tablet  Commonly known as:  NITROSTAT  Place 1 tablet (0.4 mg total) under the tongue every 5 (five) minutes as needed for chest pain.     omeprazole 40 MG capsule  Commonly known as:  PRILOSEC  Take 40 mg by mouth daily.           Follow-up Information    Follow up with TILLEY JR,W SPENCER, MD. Schedule an appointment as soon as possible for a visit in 1 month.   Specialty:  Cardiology   Contact information:   [address] [place] [address] [phone]       Follow up with [facility], MD. Schedule an appointment as soon as possible for a visit in 1 month.   Specialty:  Family Medicine   Contact information:   Dr. Daiva Eves [address] [place] [address] [phone]       Follow up with Kerry Hough, MD In 3 weeks.   Specialty:  Cardiology   Contact information:   [address] [place] [address] [phone]       The results of significant diagnostics from this hospitalization (including imaging, microbiology, ancillary and laboratory) are listed below for reference.    Significant Diagnostic Studies: Dg Chest 2 View  07/20/2014   CLINICAL DATA:  Chest pain.  Cough.  EXAM: CHEST  2 VIEW  COMPARISON:  05/31/2014  FINDINGS: Heart size and pulmonary vascularity are  normal. No infiltrates or effusions. Chronic slight pleural thickening laterally at the lung bases.  No acute osseous abnormality.  Previous right rotator cuff surgery.  IMPRESSION: No acute abnormality.   Electronically Signed   By: Lorriane Shire M.D.   On: 07/20/2014 17:37   US Renal  07/21/2014   CLINICAL DATA:  Acute kidney injury. History of left-sided nephrectomy (2010) for renal cell carcinoma. History of hypertension and diabetes.  EXAM: RENAL / URINARY TRACT ULTRASOUND COMPLETE  COMPARISON:  CT abdomen pelvis - 05/15/2007  FINDINGS: Right Kidney:  Normal cortical thickness, echogenicity and size, measuring 14.2 cm in length. Note is made of an approximately 3.2 x 2.5 x 2.6 cm exophytic well-defined cyst arising from the inferior pole of the left kidney. No echogenic renal stones. No  urinary obstruction.  Left Kidney:  Surgically absent  Bladder:  Appears normal for degree of bladder distention.  The incidentally imaged liver appears echogenic note is made of two echogenic indeterminate solid nodules adjacent to the medial aspect of the right lobe of the liver, one measuring approximately 2.1 x 1.9 x 2.1 cm and the larger measuring approximately 2.7 x 2.5 x 2.3 cm (representative image 11).  IMPRESSION: 1. No evidence of right-sided urinary obstruction. 2. Benign appearing approximately 3.2 cm right-sided renal cyst. 3. Post left-sided nephrectomy. 4. Indeterminate solid nodules adjacent to the medial aspect the right lobe of the liver, the largest of which measures approximately 2.7 cm in diameter. These are of indeterminate etiology though are not seen on remote noncontrast abdominal CT performed 05/15/2007. Further evaluation with nonemergent abdominal CT could be performed as clinically indicated.   Electronically Signed   By: Sandi Mariscal M.D.   On: 07/21/2014 10:16   Nm Myocar Multi W/spect W/wall Motion / Ef  07/21/2014    Defect 1: There is a medium defect of mild severity present in the basal   inferior, mid inferior and apical inferior location.  The study is normal.  This is a low risk study.  The left ventricular ejection fraction is mildly decreased (45-54%).  Diaphragmatic attenuation.   No ischemia. Normal Wall Motion.   Labs: Basic Metabolic Panel:  Recent Labs Lab 07/20/14 1754 07/21/14 0835  NA 138 137  K 4.3 4.1  CL 103 105  CO2 23 24  GLUCOSE 150* 150*  BUN 22* 20  CREATININE 1.39* 1.28*  CALCIUM 10.0 9.2   Liver Function Tests:  Recent Labs Lab 07/20/14 1754  AST 32  ALT 29  ALKPHOS 68  BILITOT 0.5  PROT 7.2  ALBUMIN 4.4    Recent Labs Lab 07/20/14 1754  LIPASE 31   CBC:  Recent Labs Lab 07/20/14 1754 07/21/14 0835  WBC 6.5 6.3  NEUTROABS 4.1  --   HGB 13.8 13.2  HCT 40.1 38.5*  MCV 82.0 82.6  PLT 214 204   Cardiac Enzymes:  Recent Labs Lab 07/20/14 1754 07/20/14 2225 07/21/14 0230 07/21/14 0835  TROPONINI <0.03 <0.03 <0.03 <0.03   CBG:  Recent Labs Lab 07/20/14 2301 07/21/14 0602 07/21/14 1131 07/21/14 1640  GLUCAP 128* 126* 119* 323*   Signed:  Marzetta Board  [facility] 07/21/2014, 5:28 PM

## 2014-07-21 NOTE — Consult Note (Signed)
Cardiology Consult Note  Admit date: 07/20/2014 Name: Kevin Hawkins 69 y.o.  male DOB:  1945/03/27 MRN:  [id number]  Today's date:  07/21/2014  Referring Physician:    [facility]  Primary Physician:    Dr. Lisbeth Ply   Reason for Consultation:    Chest pain  IMPRESSIONS: 1.  Prolonged chest pain that had features sounding ischemic but with a low risk myocardial perfusion scan 2.  Hyperlipidemia 3.  Hypertension 4.  History of renal cancer 5.  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  Diabetes mellitus  RECOMMENDATION: Patient has a negative myocardial perfusion scan.  His ejection fraction is lower limits of normal.  I think he can go home but should take a low-dose aspirin and a high intensity statin.  He also should be given a prescription for nitroglycerin and also should take medicine for reflux.  I would like to see him in the office in several weeks.  HISTORY: This very nice 69 year old male was seen by me about 15 years ago with chest discomfort.  At the time he had a myocardial perfusion scan as well as catheterization that was unremarkable.  Since that time he has become diabetic and also had a nephrectomy for cancer.  He was in his usual state of health until Thursday when he developed around 5 PM the onset of substernal chest discomfort that he thought might be reflux.  He was described as pressure and heaviness and he took times without relief.  The discomfort intensified and he became profusely diaphoretic and sweated.  It lasted around 30 minutes.  He saw his family doctor the next day who sent him to the hospital and he was admitted yesterday.  Troponins have been negative and EKG is unremarkable.  Earlier today he had a Lexiscan myocardial perfusion study that showed an inferior type defect without ischemia.  Echocardiogram showed an EF of 50-55% but with no significant wall motion abnormality.  He is currently pain-free.  He normally does not have  exertional chest pain.  Past Medical History  Diagnosis Date  . Hypertension   . GERD (gastroesophageal reflux disease)   . Hyperlipidemia, mixed   . ED (erectile dysfunction)   . Type II diabetes mellitus   . Skin cancer     under right eye"  . Renal cancer     S/P left nephrectomy; no chemo or radiation      Past Surgical History  Procedure Laterality Date  . Cholecystectomy    . Appendectomy    . Nephrectomy Left ~ 2010  . Pancreas surgery      "I've got 2/3 of it left"  . Skin cancer excision Right     "under eye"     Allergies:  has No Known Allergies.   Medications: Prior to Admission medications   Medication Sig Start Date End Date Taking? Authorizing Provider  amLODipine (NORVASC) 10 MG tablet Take 10 mg by mouth daily.   Yes Historical Provider, MD  aspirin 325 MG tablet Take 325 mg by mouth daily.   Yes Historical Provider, MD  fenofibrate micronized (LOFIBRA) 134 MG capsule Take 134 mg by mouth daily before breakfast.   Yes Historical Provider, MD  losartan (COZAAR) 100 MG tablet Take 100 mg by mouth daily.   Yes Historical Provider, MD  lovastatin (MEVACOR) 20 MG tablet Take 20 mg by mouth at bedtime.   Yes Historical Provider, MD  metFORMIN (GLUCOPHAGE-XR) 500 MG 24 hr tablet Take 500-1,000 mg by  mouth 2 (two) times daily. Take 1000 mg in the morning and 500 in the evening at bedtime   Yes Historical Provider, MD  omeprazole (PRILOSEC) 40 MG capsule Take 40 mg by mouth daily.   Yes Historical Provider, MD    Family History: Family Status  Relation Status Death Age  . Father Deceased 66    bone cancer  . Mother Alive     CAD  . Brother Alive     prostate cancer  . Brother Alive   . Brother Alive     diabetes  . Sister Alive     diabetes  . Sister Alive   . Sister Alive   . Sister Alive     Social History:   reports that he has quit smoking. His smoking use included Cigarettes. He has a 40 pack-year smoking history. He has never used smokeless  tobacco. He reports that he drinks alcohol. He reports that he does not use illicit drugs.   History   Social History Narrative    Review of Systems: He is mildly obese.  He has cataracts.  He has had previous skin cancers.  He has some reflux type symptoms.  Mild nocturia.  Mild arthritis. Other than as noted above the remainder of the review of systems is unremarkable.  Physical Exam: BP 114/53 mmHg  Pulse 51  Temp(Src) 98.2 F (36.8 C) (Oral)  Resp 18  Ht 6' (1.829 m)  Wt 75.4 kg (166 lb 3.6 oz)  BMI 22.54 kg/m2  SpO2 98%  General appearance: Pleasant male in no acute distress Head: Normocephalic, without obvious abnormality, atraumatic, Balding male hair pattern Eyes: conjunctivae/corneas clear. PERRL, EOM's intact. Fundi not examined Neck: no adenopathy, no carotid bruit, no JVD and supple, symmetrical, trachea midline Lungs: clear to auscultation bilaterally Heart: regular rate and rhythm, S1, S2 normal, no murmur, click, rub or gallop Abdomen: soft, non-tender; bowel sounds normal; no masses,  no organomegaly Rectal: deferred Extremities: extremities normal, atraumatic, no cyanosis or edema Pulses: 2+ and symmetric Skin: Skin color, texture, turgor normal. No rashes or lesions Neurologic: Grossly normal  Labs: CBC  Recent Labs  07/20/14 1754 07/21/14 0835  WBC 6.5 6.3  RBC 4.89 4.66  HGB 13.8 13.2  HCT 40.1 38.5*  PLT 214 204  MCV 82.0 82.6  MCH 28.2 28.3  MCHC 34.4 34.3  RDW 13.5 13.7  LYMPHSABS 1.5  --   MONOABS 0.6  --   EOSABS 0.4  --   BASOSABS 0.1  --    CMP   Recent Labs  07/20/14 1754 07/21/14 0835  NA 138 137  K 4.3 4.1  CL 103 105  CO2 23 24  GLUCOSE 150* 150*  BUN 22* 20  CREATININE 1.39* 1.28*  CALCIUM 10.0 9.2  PROT 7.2  --   ALBUMIN 4.4  --   AST 32  --   ALT 29  --   ALKPHOS 68  --   BILITOT 0.5  --   GFRNONAA 51* 56*  GFRAA 59* >60    Recent Labs  07/20/14 2225 07/21/14 0230 07/21/14 0835  TROPONINI <0.03 <0.03  <0.03     Radiology: No acute disease  EKG: Sinus rhythm, left axis deviation, PVCs  Signed:  W. Doristine Church MD [facility]   Cardiology Consultant  07/21/2014, 5:11 PM

## 2014-07-21 NOTE — Progress Notes (Signed)
Pt given discharge instructions and verbalized understanding. IV and tele removed.   Kevin Hawkins

## 2014-07-23 LAB — HEMOGLOBIN A1C
Hgb A1c MFr Bld: 7.9 % — ABNORMAL HIGH (ref 4.8–5.6)
MEAN PLASMA GLUCOSE: 180 mg/dL

## 2014-07-23 NOTE — Progress Notes (Signed)
Utilization review completed- post discharge 

## 2014-12-27 DIAGNOSIS — E2740 Unspecified adrenocortical insufficiency: Secondary | ICD-10-CM

## 2014-12-27 DIAGNOSIS — E273 Drug-induced adrenocortical insufficiency: Secondary | ICD-10-CM | POA: Diagnosis present

## 2015-03-06 DIAGNOSIS — D4411 Neoplasm of uncertain behavior of right adrenal gland: Secondary | ICD-10-CM | POA: Diagnosis not present

## 2015-03-06 DIAGNOSIS — Z7984 Long term (current) use of oral hypoglycemic drugs: Secondary | ICD-10-CM | POA: Diagnosis not present

## 2015-03-06 DIAGNOSIS — Z79899 Other long term (current) drug therapy: Secondary | ICD-10-CM | POA: Diagnosis not present

## 2015-03-06 DIAGNOSIS — E042 Nontoxic multinodular goiter: Secondary | ICD-10-CM | POA: Diagnosis not present

## 2015-03-06 DIAGNOSIS — Z905 Acquired absence of kidney: Secondary | ICD-10-CM | POA: Diagnosis not present

## 2015-03-06 DIAGNOSIS — Z7982 Long term (current) use of aspirin: Secondary | ICD-10-CM | POA: Diagnosis not present

## 2015-03-06 DIAGNOSIS — C649 Malignant neoplasm of unspecified kidney, except renal pelvis: Secondary | ICD-10-CM | POA: Diagnosis not present

## 2015-03-06 DIAGNOSIS — E273 Drug-induced adrenocortical insufficiency: Secondary | ICD-10-CM | POA: Diagnosis not present

## 2015-03-06 DIAGNOSIS — C642 Malignant neoplasm of left kidney, except renal pelvis: Secondary | ICD-10-CM | POA: Diagnosis not present

## 2015-03-28 DIAGNOSIS — H2513 Age-related nuclear cataract, bilateral: Secondary | ICD-10-CM | POA: Diagnosis not present

## 2015-03-28 DIAGNOSIS — H524 Presbyopia: Secondary | ICD-10-CM | POA: Diagnosis not present

## 2015-03-28 DIAGNOSIS — E119 Type 2 diabetes mellitus without complications: Secondary | ICD-10-CM | POA: Diagnosis not present

## 2015-03-28 DIAGNOSIS — H40003 Preglaucoma, unspecified, bilateral: Secondary | ICD-10-CM | POA: Diagnosis not present

## 2015-04-11 DIAGNOSIS — K219 Gastro-esophageal reflux disease without esophagitis: Secondary | ICD-10-CM | POA: Diagnosis not present

## 2015-04-11 DIAGNOSIS — E538 Deficiency of other specified B group vitamins: Secondary | ICD-10-CM | POA: Diagnosis not present

## 2015-04-11 DIAGNOSIS — Z6826 Body mass index (BMI) 26.0-26.9, adult: Secondary | ICD-10-CM | POA: Diagnosis not present

## 2015-04-11 DIAGNOSIS — E782 Mixed hyperlipidemia: Secondary | ICD-10-CM | POA: Diagnosis not present

## 2015-04-11 DIAGNOSIS — E2740 Unspecified adrenocortical insufficiency: Secondary | ICD-10-CM | POA: Diagnosis not present

## 2015-04-11 DIAGNOSIS — Z79899 Other long term (current) drug therapy: Secondary | ICD-10-CM | POA: Diagnosis not present

## 2015-04-11 DIAGNOSIS — E1129 Type 2 diabetes mellitus with other diabetic kidney complication: Secondary | ICD-10-CM | POA: Diagnosis not present

## 2015-04-11 DIAGNOSIS — Z125 Encounter for screening for malignant neoplasm of prostate: Secondary | ICD-10-CM | POA: Diagnosis not present

## 2015-04-11 DIAGNOSIS — I10 Essential (primary) hypertension: Secondary | ICD-10-CM | POA: Diagnosis not present

## 2015-04-25 DIAGNOSIS — E876 Hypokalemia: Secondary | ICD-10-CM | POA: Diagnosis not present

## 2015-06-05 DIAGNOSIS — E273 Drug-induced adrenocortical insufficiency: Secondary | ICD-10-CM | POA: Diagnosis not present

## 2015-06-05 DIAGNOSIS — R590 Localized enlarged lymph nodes: Secondary | ICD-10-CM | POA: Diagnosis not present

## 2015-06-05 DIAGNOSIS — Z905 Acquired absence of kidney: Secondary | ICD-10-CM | POA: Diagnosis not present

## 2015-06-05 DIAGNOSIS — N3289 Other specified disorders of bladder: Secondary | ICD-10-CM | POA: Diagnosis not present

## 2015-06-05 DIAGNOSIS — E042 Nontoxic multinodular goiter: Secondary | ICD-10-CM | POA: Diagnosis not present

## 2015-06-05 DIAGNOSIS — Z85528 Personal history of other malignant neoplasm of kidney: Secondary | ICD-10-CM | POA: Diagnosis not present

## 2015-06-05 DIAGNOSIS — R918 Other nonspecific abnormal finding of lung field: Secondary | ICD-10-CM | POA: Diagnosis not present

## 2015-06-05 DIAGNOSIS — D497 Neoplasm of unspecified behavior of endocrine glands and other parts of nervous system: Secondary | ICD-10-CM | POA: Diagnosis not present

## 2015-06-05 DIAGNOSIS — M8580 Other specified disorders of bone density and structure, unspecified site: Secondary | ICD-10-CM | POA: Diagnosis not present

## 2015-06-05 DIAGNOSIS — K5790 Diverticulosis of intestine, part unspecified, without perforation or abscess without bleeding: Secondary | ICD-10-CM | POA: Diagnosis not present

## 2015-06-05 DIAGNOSIS — K8689 Other specified diseases of pancreas: Secondary | ICD-10-CM | POA: Diagnosis not present

## 2015-06-05 DIAGNOSIS — Z9049 Acquired absence of other specified parts of digestive tract: Secondary | ICD-10-CM | POA: Diagnosis not present

## 2015-06-05 DIAGNOSIS — N2889 Other specified disorders of kidney and ureter: Secondary | ICD-10-CM | POA: Diagnosis not present

## 2015-06-05 DIAGNOSIS — E896 Postprocedural adrenocortical (-medullary) hypofunction: Secondary | ICD-10-CM | POA: Diagnosis not present

## 2015-06-05 DIAGNOSIS — C642 Malignant neoplasm of left kidney, except renal pelvis: Secondary | ICD-10-CM | POA: Diagnosis not present

## 2015-06-05 DIAGNOSIS — M488X9 Other specified spondylopathies, site unspecified: Secondary | ICD-10-CM | POA: Diagnosis not present

## 2015-06-05 DIAGNOSIS — I7090 Unspecified atherosclerosis: Secondary | ICD-10-CM | POA: Diagnosis not present

## 2015-06-05 DIAGNOSIS — Y838 Other surgical procedures as the cause of abnormal reaction of the patient, or of later complication, without mention of misadventure at the time of the procedure: Secondary | ICD-10-CM | POA: Diagnosis not present

## 2015-08-13 DIAGNOSIS — Z139 Encounter for screening, unspecified: Secondary | ICD-10-CM | POA: Diagnosis not present

## 2015-08-13 DIAGNOSIS — E1129 Type 2 diabetes mellitus with other diabetic kidney complication: Secondary | ICD-10-CM | POA: Diagnosis not present

## 2015-08-13 DIAGNOSIS — Z9181 History of falling: Secondary | ICD-10-CM | POA: Diagnosis not present

## 2015-08-13 DIAGNOSIS — Z6826 Body mass index (BMI) 26.0-26.9, adult: Secondary | ICD-10-CM | POA: Diagnosis not present

## 2015-08-13 DIAGNOSIS — E782 Mixed hyperlipidemia: Secondary | ICD-10-CM | POA: Diagnosis not present

## 2015-08-13 DIAGNOSIS — E876 Hypokalemia: Secondary | ICD-10-CM | POA: Diagnosis not present

## 2015-08-13 DIAGNOSIS — E663 Overweight: Secondary | ICD-10-CM | POA: Diagnosis not present

## 2015-08-13 DIAGNOSIS — I10 Essential (primary) hypertension: Secondary | ICD-10-CM | POA: Diagnosis not present

## 2015-10-09 DIAGNOSIS — C787 Secondary malignant neoplasm of liver and intrahepatic bile duct: Secondary | ICD-10-CM | POA: Diagnosis not present

## 2015-10-09 DIAGNOSIS — E041 Nontoxic single thyroid nodule: Secondary | ICD-10-CM | POA: Diagnosis not present

## 2015-10-09 DIAGNOSIS — C7971 Secondary malignant neoplasm of right adrenal gland: Secondary | ICD-10-CM | POA: Diagnosis not present

## 2015-10-09 DIAGNOSIS — Z905 Acquired absence of kidney: Secondary | ICD-10-CM | POA: Diagnosis not present

## 2015-10-09 DIAGNOSIS — C642 Malignant neoplasm of left kidney, except renal pelvis: Secondary | ICD-10-CM | POA: Diagnosis not present

## 2015-10-09 DIAGNOSIS — E2740 Unspecified adrenocortical insufficiency: Secondary | ICD-10-CM | POA: Diagnosis not present

## 2015-10-09 DIAGNOSIS — C641 Malignant neoplasm of right kidney, except renal pelvis: Secondary | ICD-10-CM | POA: Diagnosis not present

## 2015-10-09 DIAGNOSIS — Q453 Other congenital malformations of pancreas and pancreatic duct: Secondary | ICD-10-CM | POA: Diagnosis not present

## 2015-10-09 DIAGNOSIS — D497 Neoplasm of unspecified behavior of endocrine glands and other parts of nervous system: Secondary | ICD-10-CM | POA: Diagnosis not present

## 2015-10-09 DIAGNOSIS — C649 Malignant neoplasm of unspecified kidney, except renal pelvis: Secondary | ICD-10-CM | POA: Diagnosis present

## 2015-10-30 DIAGNOSIS — C649 Malignant neoplasm of unspecified kidney, except renal pelvis: Secondary | ICD-10-CM | POA: Diagnosis not present

## 2015-10-30 DIAGNOSIS — C642 Malignant neoplasm of left kidney, except renal pelvis: Secondary | ICD-10-CM | POA: Diagnosis not present

## 2015-10-30 DIAGNOSIS — Z905 Acquired absence of kidney: Secondary | ICD-10-CM | POA: Diagnosis not present

## 2015-10-30 DIAGNOSIS — E2740 Unspecified adrenocortical insufficiency: Secondary | ICD-10-CM | POA: Diagnosis not present

## 2015-10-30 DIAGNOSIS — E041 Nontoxic single thyroid nodule: Secondary | ICD-10-CM | POA: Diagnosis not present

## 2015-11-02 ENCOUNTER — Encounter (HOSPITAL_COMMUNITY): Payer: Self-pay | Admitting: *Deleted

## 2015-11-02 ENCOUNTER — Other Ambulatory Visit: Payer: Self-pay

## 2015-11-02 DIAGNOSIS — Z85828 Personal history of other malignant neoplasm of skin: Secondary | ICD-10-CM | POA: Insufficient documentation

## 2015-11-02 DIAGNOSIS — Z7982 Long term (current) use of aspirin: Secondary | ICD-10-CM | POA: Diagnosis not present

## 2015-11-02 DIAGNOSIS — Z7984 Long term (current) use of oral hypoglycemic drugs: Secondary | ICD-10-CM | POA: Insufficient documentation

## 2015-11-02 DIAGNOSIS — N182 Chronic kidney disease, stage 2 (mild): Secondary | ICD-10-CM | POA: Insufficient documentation

## 2015-11-02 DIAGNOSIS — R079 Chest pain, unspecified: Principal | ICD-10-CM | POA: Insufficient documentation

## 2015-11-02 DIAGNOSIS — I129 Hypertensive chronic kidney disease with stage 1 through stage 4 chronic kidney disease, or unspecified chronic kidney disease: Secondary | ICD-10-CM | POA: Insufficient documentation

## 2015-11-02 DIAGNOSIS — Z85528 Personal history of other malignant neoplasm of kidney: Secondary | ICD-10-CM | POA: Diagnosis not present

## 2015-11-02 DIAGNOSIS — E119 Type 2 diabetes mellitus without complications: Secondary | ICD-10-CM | POA: Insufficient documentation

## 2015-11-02 DIAGNOSIS — Z85048 Personal history of other malignant neoplasm of rectum, rectosigmoid junction, and anus: Secondary | ICD-10-CM | POA: Diagnosis not present

## 2015-11-02 DIAGNOSIS — Z87891 Personal history of nicotine dependence: Secondary | ICD-10-CM | POA: Insufficient documentation

## 2015-11-02 DIAGNOSIS — R071 Chest pain on breathing: Secondary | ICD-10-CM | POA: Diagnosis not present

## 2015-11-02 LAB — CBC
HCT: 40 % (ref 39.0–52.0)
Hemoglobin: 13.4 g/dL (ref 13.0–17.0)
MCH: 26 pg (ref 26.0–34.0)
MCHC: 33.5 g/dL (ref 30.0–36.0)
MCV: 77.7 fL — ABNORMAL LOW (ref 78.0–100.0)
PLATELETS: 201 10*3/uL (ref 150–400)
RBC: 5.15 MIL/uL (ref 4.22–5.81)
RDW: 16.7 % — AB (ref 11.5–15.5)
WBC: 5.9 10*3/uL (ref 4.0–10.5)

## 2015-11-02 NOTE — ED Triage Notes (Signed)
The pt is c/o mid-chest pain for 2 hours.  No chest pain now.   No other symptoms

## 2015-11-03 ENCOUNTER — Observation Stay (HOSPITAL_COMMUNITY)
Admission: EM | Admit: 2015-11-03 | Discharge: 2015-11-03 | Disposition: A | Discharge status: Home / Self Care | Payer: PPO | Attending: Family Medicine | Admitting: Family Medicine

## 2015-11-03 ENCOUNTER — Encounter (HOSPITAL_COMMUNITY): Payer: Self-pay | Admitting: Family Medicine

## 2015-11-03 ENCOUNTER — Emergency Department (HOSPITAL_COMMUNITY): Payer: PPO

## 2015-11-03 DIAGNOSIS — E785 Hyperlipidemia, unspecified: Secondary | ICD-10-CM | POA: Diagnosis not present

## 2015-11-03 DIAGNOSIS — E2740 Unspecified adrenocortical insufficiency: Secondary | ICD-10-CM

## 2015-11-03 DIAGNOSIS — R079 Chest pain, unspecified: Secondary | ICD-10-CM | POA: Diagnosis present

## 2015-11-03 DIAGNOSIS — E119 Type 2 diabetes mellitus without complications: Secondary | ICD-10-CM

## 2015-11-03 DIAGNOSIS — I10 Essential (primary) hypertension: Secondary | ICD-10-CM | POA: Diagnosis present

## 2015-11-03 DIAGNOSIS — Z85528 Personal history of other malignant neoplasm of kidney: Secondary | ICD-10-CM | POA: Diagnosis not present

## 2015-11-03 DIAGNOSIS — C649 Malignant neoplasm of unspecified kidney, except renal pelvis: Secondary | ICD-10-CM | POA: Diagnosis present

## 2015-11-03 DIAGNOSIS — N182 Chronic kidney disease, stage 2 (mild): Secondary | ICD-10-CM | POA: Diagnosis not present

## 2015-11-03 DIAGNOSIS — R0789 Other chest pain: Secondary | ICD-10-CM

## 2015-11-03 DIAGNOSIS — Z85048 Personal history of other malignant neoplasm of rectum, rectosigmoid junction, and anus: Secondary | ICD-10-CM | POA: Diagnosis not present

## 2015-11-03 DIAGNOSIS — Z87891 Personal history of nicotine dependence: Secondary | ICD-10-CM | POA: Diagnosis not present

## 2015-11-03 DIAGNOSIS — Z85828 Personal history of other malignant neoplasm of skin: Secondary | ICD-10-CM | POA: Diagnosis not present

## 2015-11-03 DIAGNOSIS — Z7984 Long term (current) use of oral hypoglycemic drugs: Secondary | ICD-10-CM | POA: Diagnosis not present

## 2015-11-03 DIAGNOSIS — I129 Hypertensive chronic kidney disease with stage 1 through stage 4 chronic kidney disease, or unspecified chronic kidney disease: Secondary | ICD-10-CM | POA: Diagnosis not present

## 2015-11-03 DIAGNOSIS — Z7982 Long term (current) use of aspirin: Secondary | ICD-10-CM | POA: Diagnosis not present

## 2015-11-03 DIAGNOSIS — E273 Drug-induced adrenocortical insufficiency: Secondary | ICD-10-CM | POA: Diagnosis present

## 2015-11-03 LAB — I-STAT TROPONIN, ED: Troponin i, poc: 0.01 ng/mL (ref 0.00–0.08)

## 2015-11-03 LAB — TROPONIN I
Troponin I: <0.03 ng/mL
Troponin I: <0.03 ng/mL

## 2015-11-03 LAB — GLUCOSE, CAPILLARY
Glucose-Capillary: 147 mg/dL — ABNORMAL HIGH (ref 65–99)
Glucose-Capillary: 159 mg/dL — ABNORMAL HIGH (ref 65–99)

## 2015-11-03 LAB — BASIC METABOLIC PANEL
Anion gap: 14 (ref 5–15)
BUN: 15 mg/dL (ref 6–20)
CO2: 21 mmol/L — ABNORMAL LOW (ref 22–32)
Calcium: 9.8 mg/dL (ref 8.9–10.3)
Chloride: 104 mmol/L (ref 101–111)
Creatinine, Ser: 1.02 mg/dL (ref 0.61–1.24)
GFR calc non Af Amer: >60 mL/min (ref >60)
GLUCOSE: 166 mg/dL — AB (ref 65–99)
Potassium: 3.9 mmol/L (ref 3.5–5.1)
Sodium: 139 mmol/L (ref 135–145)

## 2015-11-03 MED ORDER — ACETAMINOPHEN 325 MG PO TABS: 650.0000 mg | ORAL_TABLET | ORAL | Status: DC | PRN

## 2015-11-03 MED ORDER — FLUDROCORTISONE ACETATE 0.1 MG PO TABS
0.0500 mg | ORAL_TABLET | Freq: Every day | ORAL | Status: DC
  Administered 2015-11-03: 0.05 mg via ORAL
  Filled 2015-11-03: qty 0.5

## 2015-11-03 MED ORDER — LOSARTAN POTASSIUM 50 MG PO TABS
100.0000 mg | ORAL_TABLET | Freq: Every day | ORAL | Status: DC
  Administered 2015-11-03: 100 mg via ORAL
  Filled 2015-11-03 (×2): qty 2

## 2015-11-03 MED ORDER — FUROSEMIDE 10 MG/ML IJ SOLN: 80.0000 mg | Freq: Once | INTRAMUSCULAR | Status: DC

## 2015-11-03 MED ORDER — ATORVASTATIN CALCIUM 40 MG PO TABS
40.0000 mg | ORAL_TABLET | Freq: Every day | ORAL | Status: DC
Start: 2015-11-04 — End: 2015-11-03

## 2015-11-03 MED ORDER — GI COCKTAIL ~~LOC~~: 30.0000 mL | Freq: Four times a day (QID) | ORAL | Status: DC | PRN

## 2015-11-03 MED ORDER — ONDANSETRON HCL 4 MG/2ML IJ SOLN: 4.0000 mg | Freq: Four times a day (QID) | INTRAMUSCULAR | Status: DC | PRN

## 2015-11-03 MED ORDER — FENOFIBRATE 160 MG PO TABS: 160.0000 mg | ORAL_TABLET | Freq: Every day | ORAL | 0 refills | Status: AC

## 2015-11-03 MED ORDER — ATORVASTATIN CALCIUM 40 MG PO TABS: 40.0000 mg | ORAL_TABLET | Freq: Every day | ORAL | 0 refills | Status: AC

## 2015-11-03 MED ORDER — ENOXAPARIN SODIUM 40 MG/0.4ML ~~LOC~~ SOLN
40.0000 mg | Freq: Every day | SUBCUTANEOUS | Status: DC
  Filled 2015-11-03: qty 0.4

## 2015-11-03 MED ORDER — DEXAMETHASONE 0.5 MG PO TABS
0.5000 mg | ORAL_TABLET | Freq: Every day | ORAL | Status: DC
  Administered 2015-11-03: 0.5 mg via ORAL
  Filled 2015-11-03: qty 1

## 2015-11-03 MED ORDER — ASPIRIN 81 MG PO CHEW
324.0000 mg | CHEWABLE_TABLET | Freq: Once | ORAL | Status: AC
Start: 2015-11-03 — End: 2015-11-03
  Administered 2015-11-03: 324 mg via ORAL
  Filled 2015-11-03: qty 4

## 2015-11-03 MED ORDER — PAZOPANIB HCL 200 MG PO TABS: 600.0000 mg | ORAL_TABLET | Freq: Every day | ORAL | Status: DC

## 2015-11-03 MED ORDER — GLIPIZIDE 10 MG PO TABS
10.0000 mg | ORAL_TABLET | Freq: Every day | ORAL | Status: DC
  Administered 2015-11-03: 10 mg via ORAL
  Filled 2015-11-03: qty 1

## 2015-11-03 MED ORDER — ASPIRIN 325 MG PO TABS
325.0000 mg | ORAL_TABLET | Freq: Every day | ORAL | Status: DC
  Administered 2015-11-03: 325 mg via ORAL
  Filled 2015-11-03: qty 1

## 2015-11-03 MED ORDER — AMLODIPINE BESYLATE 5 MG PO TABS
10.0000 mg | ORAL_TABLET | Freq: Every day | ORAL | Status: DC
Start: 2015-11-03 — End: 2015-11-03
  Administered 2015-11-03: 10 mg via ORAL
  Filled 2015-11-03 (×2): qty 2

## 2015-11-03 MED ORDER — FENOFIBRATE 160 MG PO TABS: 160.0000 mg | ORAL_TABLET | Freq: Every day | ORAL | Status: DC

## 2015-11-03 NOTE — ED Notes (Signed)
Attempted to call report

## 2015-11-03 NOTE — Consult Note (Addendum)
Admit date: 11/03/2015 Referring Physician  Dr. Elon Jester Primary Physician Leonides Sake, MD Primary Cardiologist: Dr. Wynonia Lawman Reason for Consultation  chest pain  HPI: 70 year old male with history of metastatic renal carcinoma followed at [facility], diabetes, hypertension, chronic diastolic heart failure, adrenal insufficiency present with chest discomfort.  Cardiac catheterization was performed about 15 years ago, no stent placement. Pharmacologic nuclear stress test was performed  approximately 1 year ago on 07/20/14 that was low risk but no ischemia.  His chest discomfort felt when walking uphill like reflux but not burning with some shortness of breath. When he sat down his pain subsided. When laying the bed, his chest pain returned and radiate to her shoulder and felt soreness in his left jaw as well. This worried him.  Troponins have been normal. EKG also unremarkable.  Unfortunately has had progression of his metastatic renal cancer. New medication Votrient can produce hypertension per oncology notes.  Currently this morning is feeling better. Wife at bedside. No chest pain, no shortness of breath, no fevers, no syncope.  PMH:   Past Medical History:  Diagnosis Date  . ED (erectile dysfunction)   . GERD (gastroesophageal reflux disease)   . Hyperlipidemia, mixed   . Hypertension   . Renal cancer ([facility])    S/P left nephrectomy; no chemo or radiation  . Skin cancer    under right eye"  . Type II diabetes mellitus ([facility])     PSH:   Past Surgical History:  Procedure Laterality Date  . APPENDECTOMY    . CHOLECYSTECTOMY    . NEPHRECTOMY Left ~ 2010  . PANCREAS SURGERY     "I've got 2/3 of it left"  . SKIN CANCER EXCISION Right    "under eye"   Allergies:  Review of patient's allergies indicates no known allergies. Prior to Admit Meds:   Prior to Admission medications   Medication Sig Start Date End Date Taking? Authorizing Provider  amLODipine (NORVASC) 10 MG tablet Take  10 mg by mouth daily.   Yes Historical Provider, MD  aspirin 325 MG tablet Take 325 mg by mouth daily.   Yes Historical Provider, MD  dexamethasone (DECADRON) 1 MG tablet Take 1 mg by mouth daily. 10/04/15  Yes Historical Provider, MD  fludrocortisone (FLORINEF) 0.1 MG tablet Take 0.05 mg by mouth daily. 10/18/15  Yes Historical Provider, MD  glipiZIDE (GLUCOTROL) 10 MG tablet Take 10 mg by mouth daily. 10/04/15  Yes Historical Provider, MD  losartan (COZAAR) 100 MG tablet Take 100 mg by mouth daily.   Yes Historical Provider, MD  magnesium oxide (MAG-OX) 400 (241.3 Mg) MG tablet Take 800 mg by mouth daily.   Yes Historical Provider, MD  metFORMIN (GLUCOPHAGE-XR) 500 MG 24 hr tablet Take 500-1,000 mg by mouth 2 (two) times daily. Take 1000 mg in the morning and 500 in the evening at bedtime   Yes Historical Provider, MD  nitroGLYCERIN (NITROSTAT) 0.4 MG SL tablet Place 1 tablet (0.4 mg total) under the tongue every 5 (five) minutes as needed for chest pain. 07/21/14  Yes Costin Karlyne Greenspan, MD  omeprazole (PRILOSEC) 40 MG capsule Take 40 mg by mouth daily.   Yes Historical Provider, MD  pazopanib (VOTRIENT) 200 MG tablet Take 600 mg by mouth daily. 10/09/15  Yes Historical Provider, MD  potassium chloride SA (K-DUR,KLOR-CON) 20 MEQ tablet Take 20 mEq by mouth daily.   Yes Historical Provider, MD  vitamin B-12 (CYANOCOBALAMIN) 100 MCG tablet Take 50 mcg by mouth daily.  Yes Historical Provider, MD    Scheduled Meds: . amLODipine  10 mg Oral Daily  . aspirin  325 mg Oral Daily  . [START ON 11/04/2015] atorvastatin  40 mg Oral Daily  . dexamethasone  0.5 mg Oral Daily  . enoxaparin (LOVENOX) injection  40 mg Subcutaneous Daily  . [START ON 11/04/2015] fenofibrate  160 mg Oral Daily  . fludrocortisone  0.05 mg Oral Q breakfast  . glipiZIDE  10 mg Oral Q breakfast  . losartan  100 mg Oral Daily  . pazopanib  600 mg Oral Daily   Continuous Infusions:  PRN Meds:.acetaminophen, gi cocktail, ondansetron  (ZOFRAN) IV  Fam HX:    Family History  Problem Relation Age of Onset  . Cancer Father     Possible bone cancer  . Hypertension Mother   . Heart attack Mother   . Diabetes Sister   . Prostate cancer Brother    Social HX:    Social History   Social History  . Marital status: Married    Spouse name: N/A  . Number of children: N/A  . Years of education: N/A   Occupational History  . Not on file.   Social History Main Topics  . Smoking status: Former Smoker    Packs/day: 2.00    Years: 20.00    Types: Cigarettes  . Smokeless tobacco: Never Used     Comment: "stopped smoking cigarettes in the early 1970's"  . Alcohol use Yes     Comment: "stopped drinking in the early 1970's"  . Drug use: No  . Sexual activity: Not Currently   Other Topics Concern  . Not on file   Social History Narrative  . No narrative on file     ROS:  All 11 ROS were addressed and are negative except what is stated in the HPI   Physical Exam: Blood pressure (!) 163/81, pulse (!) 55, temperature 98.2 F (36.8 C), temperature source Oral, resp. rate 20, height '5\' 7"'$  (1.702 m), weight 166 lb 11.2 oz (75.6 kg), SpO2 100 %.   General: Well developed, well nourished, in no acute distress Head: Eyes PERRLA, No xanthomas.   Normal cephalic and atramatic  Lungs:   Clear bilaterally to auscultation and percussion. Normal respiratory effort. No wheezes, no rales. Heart:   HRRR S1 S2 Pulses are 2+ & equal. No murmur, rubs, gallops.  No carotid bruit. No JVD.  No abdominal bruits.  Abdomen: Bowel sounds are positive, abdomen soft and non-tender without masses. No hepatosplenomegaly. Msk:  Back normal. Normal strength and tone for age. Extremities:  No clubbing, cyanosis or edema.  DP +1 Neuro: Alert and oriented X 3, non-focal, MAE x 4 GU: Deferred Rectal: Deferred Psych:  Good affect, responds appropriately      Labs: Lab Results  Component Value Date   WBC 5.9 11/02/2015   HGB 13.4 11/02/2015     HCT 40.0 11/02/2015   MCV 77.7 (L) 11/02/2015   PLT 201 11/02/2015     Recent Labs Lab 11/02/15 2352  NA 139  K 3.9  CL 104  CO2 21*  BUN 15  CREATININE 1.02  CALCIUM 9.8  GLUCOSE 166*    Recent Labs  11/03/15 0458  TROPONINI <0.03   Lab Results  Component Value Date   CHOL 169 07/21/2014   HDL 42 07/21/2014   LDLCALC 98 07/21/2014   TRIG 144 07/21/2014   No results found for: DDIMER   Radiology:  Dg Chest 2 View  Result Date: 11/03/2015 CLINICAL DATA:  Mid chest pain tonight. EXAM: CHEST  2 VIEW COMPARISON:  Radiographs 08/31/2014, lung bases from CT abdomen 08/21/2014 FINDINGS: Heart size and mediastinal contours are normal. Mild hyperinflation is unchanged. Subpleural thickening secondary to lipomatosis as seen on prior CT. No focal airspace disease, pulmonary edema, or pleural effusion. Presumed nipple shadows are noted. Surgical clips in the upper abdomen. IMPRESSION: No active cardiopulmonary disease. Electronically Signed   By: Jeb Levering M.D.   On: 11/03/2015 00:34   Personally viewed.  EKG:  Sinus rhythm, no ST segment changes. Repeat EKG shows limb lead reversal. Personally viewed.   Echocardiogram from 2016 June: EF 00-51% Grade 2 diastolic dysfunction No significant valvular disease  NUC stress June 2016: Low risk, no ischemia  Cardiac catheterization in 2002 approximately  - No PCI  ASSESSMENT/PLAN:    70 year old male with metastatic renal cell carcinoma with newly started chemotherapeutic with chest discomfort.  Chest pain  - Serial troponins have been normal. Reassuring.  - Recent pharmacologic nuclear stress test reassuring with no ischemia.  - Recent echocardiogram also reassuring with normal EF.  - His chest discomfort could be possibly GERD related or perhaps related to his new chemotherapy. At this point, doubt cardiac etiology.  - I'm comfortable with him being discharged. He may follow-up with Dr. Wynonia Lawman, cardiology as  needed.  Diabetes  - Hemoglobin A1c 7.9 previously in 2016. Managed by primary team  Hyperlipidemia  - LDL previously 98. Continue current medications.  Renal cell carcinoma metastatic  - New chemotherapeutic agent.  Candee Furbish, MD  11/03/2015  10:26 AM

## 2015-11-03 NOTE — ED Provider Notes (Signed)
Kevin Hawkins Provider Note   CSN: [id number] Arrival date & time: 11/02/15  2328  By signing my name below, I, Kevin Hawkins, attest that this documentation has been prepared under the direction and in the presence of Kevin Biles, MD. Electronically Signed: Gwenlyn Hawkins, ED Scribe. 11/03/15. 3:28 AM.   History   Chief Complaint Chief Complaint  Patient presents with  . Chest Pain    The history is provided by the patient. No language interpreter was used.    HPI Comments: Kevin Hawkins is a 70 y.o. male with DM, HTN, and Kidney Cancer who presents to the Emergency Department complaining of gradually onset and worsening, waxing and waning chest pain onset 6 PM. He states pain begins in the epigastric region and radiates up into his chest. Sometimes it radiated to the left side of his chest. Chest pain, similar to acid reflux but did not burn like acid reflux. He was walking around 6 PM when he first experienced the chest pain. He states he sat down in a chair and it began gradually worsening. He tried to go to sleep, but the pain kept him from sleeping. Pt reports associated shortness of breath. No hx of MI. Pt is currently receiving oral chemotherapy, Denies dizziness, nausea.  Past Medical History:  Diagnosis Date  . ED (erectile dysfunction)   . GERD (gastroesophageal reflux disease)   . Hyperlipidemia, mixed   . Hypertension   . Renal cancer ([place])    S/P left nephrectomy; no chemo or radiation  . Skin cancer    under right eye"  . Type II diabetes mellitus [facility])     Patient Active Problem List   Diagnosis Date Noted  . Chronic kidney disease, stage 2, mildly decreased GFR 07/21/2014  . History of kidney cancer 07/21/2014  . Diabetes mellitus type 2, noninsulin dependent ([place])   . Chest pain 07/20/2014  . GERD (gastroesophageal reflux disease)   . Hyperlipidemia, mixed   . Diabetes mellitus without complication ([place])   . Pain in the chest   . Essential  hypertension     Past Surgical History:  Procedure Laterality Date  . APPENDECTOMY    . CHOLECYSTECTOMY    . NEPHRECTOMY Left ~ 2010  . PANCREAS SURGERY     "I've got 2/3 of it left"  . SKIN CANCER EXCISION Right    "under eye"       Home Medications    Prior to Admission medications   Medication Sig Start Date End Date Taking? Authorizing Provider  amLODipine (NORVASC) 10 MG tablet Take 10 mg by mouth daily.    Historical Provider, MD  aspirin 325 MG tablet Take 325 mg by mouth daily.    Historical Provider, MD  atorvastatin (LIPITOR) 40 MG tablet Take 1 tablet (40 mg total) by mouth daily. 07/21/14   Kevin Karlyne Greenspan, MD  fenofibrate micronized (LOFIBRA) 134 MG capsule Take 134 mg by mouth daily before breakfast.    Historical Provider, MD  losartan (COZAAR) 100 MG tablet Take 100 mg by mouth daily.    Historical Provider, MD  metFORMIN (GLUCOPHAGE-XR) 500 MG 24 hr tablet Take 500-1,000 mg by mouth 2 (two) times daily. Take 1000 mg in the morning and 500 in the evening at bedtime    Historical Provider, MD  nitroGLYCERIN (NITROSTAT) 0.4 MG SL tablet Place 1 tablet (0.4 mg total) under the tongue every 5 (five) minutes as needed for chest pain. 07/21/14   Kevin Karlyne Greenspan, MD  omeprazole (  PRILOSEC) 40 MG capsule Take 40 mg by mouth daily.    Historical Provider, MD    Family History Family History  Problem Relation Age of Onset  . Cancer Father     Possible bone cancer  . Hypertension Mother   . Heart attack Mother   . Diabetes Sister   . Prostate cancer Brother     Social History Social History  Substance Use Topics  . Smoking status: Former Smoker    Packs/day: 2.00    Years: 20.00    Types: Cigarettes  . Smokeless tobacco: Never Used     Comment: "stopped smoking cigarettes in the early 1970's"  . Alcohol use Yes     Comment: "stopped drinking in the early 1970's"     Allergies   Review of patient's allergies indicates no known allergies.   Review of  Systems Review of Systems 10 Systems reviewed and are negative for acute change except as noted in the HPI.  Physical Exam Updated Vital Signs BP 168/91 (BP Location: Right Arm)   Pulse 76   Temp 97.9 F (36.6 C) (Oral)   Resp 20   Ht '5\' 6"'$  (1.676 m)   Wt 169 lb (76.7 kg)   SpO2 96%   BMI 27.28 kg/m   Physical Exam  Constitutional: He is oriented to person, place, and time. He appears well-developed and well-nourished. No distress.  HENT:  Head: Normocephalic and atraumatic.  Eyes: Conjunctivae are normal.  Cardiovascular: Normal rate, regular rhythm and normal heart sounds.   Pulmonary/Chest: Effort normal and breath sounds normal. No respiratory distress. He has no wheezes. He has no rales.  Neurological: He is alert and oriented to person, place, and time.  Skin: Skin is warm and dry.  Nursing note and vitals reviewed.  ED Treatments / Results  DIAGNOSTIC STUDIES: Oxygen Saturation is 96% on RA, adequate by my interpretation.    COORDINATION OF CARE: 3:28 AM Discussed treatment plan with pt at bedside which includes cardiac monitoring and pt agreed to plan.  Labs (all labs ordered are listed, but only abnormal results are displayed) Labs Reviewed  BASIC METABOLIC PANEL - Abnormal; Notable for the following:       Result Value   CO2 21 (*)    Glucose, Bld 166 (*)    All other components within normal limits  CBC - Abnormal; Notable for the following:    MCV 77.7 (*)    RDW 16.7 (*)    All other components within normal limits  I-STAT TROPOININ, ED  I-STAT TROPOININ, ED    EKG  EKG Interpretation  Date/Time:  Saturday November 02 2015 23:31:08 EDT Ventricular Rate:  73 PR Interval:  134 QRS Duration: 96 QT Interval:  400 QTC Calculation: 440 R Axis:   15 Text Interpretation:  Normal sinus rhythm with sinus arrhythmia Incomplete right bundle branch block Borderline ECG When compared with ECG of 07/20/2014, Premature ventricular complexes are no longer  Present Confirmed by Roxanne Mins  MD, DAVID ([id number]) on 11/03/2015 12:45:41 AM       Radiology Dg Chest 2 View  Result Date: 11/03/2015 CLINICAL DATA:  Mid chest pain tonight. EXAM: CHEST  2 VIEW COMPARISON:  Radiographs 08/31/2014, lung bases from CT abdomen 08/21/2014 FINDINGS: Heart size and mediastinal contours are normal. Mild hyperinflation is unchanged. Subpleural thickening secondary to lipomatosis as seen on prior CT. No focal airspace disease, pulmonary edema, or pleural effusion. Presumed nipple shadows are noted. Surgical clips in the upper abdomen. IMPRESSION: No  active cardiopulmonary disease. Electronically Signed   By: Jeb Levering M.D.   On: 11/03/2015 00:34    Procedures Procedures (including critical care time)  Medications Ordered in ED Medications - No data to display   Initial Impression / Assessment and Plan / ED Course  I have reviewed the triage vital signs and the nursing notes.  Pertinent labs & imaging results that were available during my care of the patient were reviewed by me and considered in my medical decision making (see chart for details).  Clinical Course   I personally performed the services described in this documentation, which was scribed in my presence. The recorded information has been reviewed and is accurate.  Pt comes in with cc of chest pain. Chest pain started with exertion, and since then has been intermittent. Pain is epigastric, radiates to the L side on occasions and there is some associated dib. PT has DM hx and also hx of renal CA on oral chemo.  EKG has no concerning findings. HEAR score is 5 - 2 for age and 2 for risk factors, 1 for hx. We will admit to medicine.  Final Clinical Impressions(s) / ED Diagnoses   Final diagnoses:  Chest pain, unspecified chest pain type    New Prescriptions New Prescriptions   No medications on file     Kevin Biles, MD 11/03/15 [phone]

## 2015-11-03 NOTE — H&P (Signed)
History and Physical  Patient Name: Kevin Hawkins     WUX:[id number]    DOB: Sep 18, 1945    DOA: 11/03/2015 PCP: Leonides Sake, MD   Patient coming from: Home     Chief Complaint: Chest pain  HPI: Kevin Hawkins is a 70 y.o. male with a past medical history significant for metastatic renal CA followed at [facility] on [address] for two weeks, NIDDM, HTN, chronic diastolic CHF and adrenal insufficiency who presents with chest pain.  Today he was walking up the road when he had onset of chest discomfort "like reflux but not burning" with shortness of breath. He was able to finish his walking, came home and sat down to rest and the pain subsided.  Later this evening, he went to bed, but the chest pain returned, and this time radiated to his shoulder and was associated with a soreness in the left jaw, so he came to the ER.  The pain is indigestion or reflux like and central, not positional and he had no large meals today.   ED course: -Afebrile, heart rate 70s, respirations 20, pulse oximetry normal, blood pressure 170/90 -Initial ECG showed nromal sinus rhythm and troponin was negative. -Na 139, K 3.9, Cr 1.02 (baseline 0.9-1.0, has only half kidney left after surgery for cancer), WBC 5.9, Hgb 13.4 -Chest x-ray clear -TRH was asked to admit for observation, serial troponins and risk stratification.  The patient used to follow with Dr. Wynonia Lawman.  He had a catheterization years ago, no stents.  He had an episode of chest pain one yaer ago, had an echo then that was normal except for diastolic dysfunction and a low risk NM perfusion scan.  (A renal US because of AKI at the time is what detected the return of his renal cancer as an incidental finding).    The patient recently started oral chemotherapy with his oncologist at [facility] because of progression of his metastatic renal cell cancer.  This medication, Votrient, can produce hypertension per Onc notes.     Review of Systems:  Pt  complains of chest pain, shortness of breath. All other systems negative except as just noted or noted in the history of present illness.  Past Medical History:  Diagnosis Date  . ED (erectile dysfunction)   . GERD (gastroesophageal reflux disease)   . Hyperlipidemia, mixed   . Hypertension   . Renal cancer ([place])    S/P left nephrectomy; no chemo or radiation  . Skin cancer    under right eye"  . Type II diabetes mellitus ([place])     Past Surgical History:  Procedure Laterality Date  . APPENDECTOMY    . CHOLECYSTECTOMY    . NEPHRECTOMY Left ~ 2010  . PANCREAS SURGERY     "I've got 2/3 of it left"  . SKIN CANCER EXCISION Right    "under eye"    Social History: Patient lives with his wife and also their daugther and grandchildren are staying with them now.  Patient walks unassisted.  He is a former smoker.  Lives in [place].  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  No Known Allergies  Family history: family history includes Cancer in his father; Diabetes in his sister; Heart attack in his mother; Hypertension in his mother; Prostate cancer in his brother.  Prior to Admission medications   Medication Sig Start Date End Date Taking? Authorizing Provider  amLODipine (NORVASC) 10 MG tablet Take 10 mg by mouth daily.    Historical Provider, MD  aspirin 325 MG tablet Take 325 mg by mouth daily.    Historical Provider, MD  atorvastatin (LIPITOR) 40 MG tablet Take 1 tablet (40 mg total) by mouth daily. 07/21/14   Costin Karlyne Greenspan, MD  fenofibrate micronized (LOFIBRA) 134 MG capsule Take 134 mg by mouth daily before breakfast.    Historical Provider, MD  losartan (COZAAR) 100 MG tablet Take 100 mg by mouth daily.    Historical Provider, MD  metFORMIN (GLUCOPHAGE-XR) 500 MG 24 hr tablet Take 500-1,000 mg by mouth 2 (two) times daily. Take 1000 mg in the morning and 500 in the evening at bedtime    Historical Provider, MD  nitroGLYCERIN (NITROSTAT) 0.4 MG SL tablet Place 1 tablet (0.4 mg total) under the  tongue every 5 (five) minutes as needed for chest pain. 07/21/14   Costin Karlyne Greenspan, MD  omeprazole (PRILOSEC) 40 MG capsule Take 40 mg by mouth daily.    Historical Provider, MD       Physical Exam: BP 168/91 (BP Location: Right Arm)   Pulse 76   Temp 97.9 F (36.6 C) (Oral)   Resp 20   Ht '5\' 6"'$  (1.676 m)   Wt 76.7 kg (169 lb)   SpO2 96%   BMI 27.28 kg/m  General appearance: Well-developed, adult male, alert and in no acute distress.   Eyes: Anicteric, conjunctiva pink, lids and lashes normal.     ENT: No nasal deformity, discharge, or epistaxis.  OP moist without lesions.   Skin: Warm and dry.   Cardiac: RRR, nl S1-S2, no murmurs appreciated.  Capillary refill is brisk.  JVP normal.  Mild LE edema.  Radial and DP pulses 2+ and symmetric.  No carotid bruits. Respiratory: Normal respiratory rate and rhythm.  CTAB without rales or wheezes. GI: Abdomen soft without rigidity.  No TTP. No ascites, distension.   MSK: No deformities or effusions.   Pain not reproduced with palpation of precordium.  No pain with arm movement. Neuro: Sensorium intact and responding to questions, attention normal.  Speech is fluent.  Moves all extremities equally and with normal coordination.    Psych: Behavior appropriate.  Affect normal.  No evidence of aural or visual hallucinations or delusions.       Labs on Admission:  The metabolic panel shows normal electrolytes and currently normal renal function. The complete blood count shows no leukocytosis, anemia, thrombocytopenia. The initial troponin is negative.  Radiological Exams on Admission: Personally reviewed chest x-ray shows no focal opacity: Dg Chest 2 View  Result Date: 11/03/2015 CLINICAL DATA:  Mid chest pain tonight. EXAM: CHEST  2 VIEW COMPARISON:  Radiographs 08/31/2014, lung bases from CT abdomen 08/21/2014 FINDINGS: Heart size and mediastinal contours are normal. Mild hyperinflation is unchanged. Subpleural thickening secondary to  lipomatosis as seen on prior CT. No focal airspace disease, pulmonary edema, or pleural effusion. Presumed nipple shadows are noted. Surgical clips in the upper abdomen. IMPRESSION: No active cardiopulmonary disease. Electronically Signed   By: Jeb Levering M.D.   On: 11/03/2015 00:34    EKG: Independently reviewed. Rate 73, QTC 440, no ST changes.  Echocardiogram from 2016 June: EF 76-73% Grade 2 diastolic dysfunction No significant valvular disease    Assessment/Plan 1. Chest pain: This is new.  The patient has HEART score of 4. Angina is typical in that it is dull and exertional and seems to be relieved by rest.  Onset initially about 4-5 hours before presentation.  Other potential causes of chest pain (PE, dissection,  pancreatitis, pneumonia/effusion, pericarditis) are doubted.  We have been asked to admit the patient for observation and etiology consultation with Cardiology tomorrow.  -Serial troponins are ordered -Telemetry -Echocardiogram ordered -Consult to cardiology, appreciate recommendations -Continue aspirin   2. NIDDM:  -Continue glipizide for now -Hold metformin  3. HTN:  -Continue amlodipine, losartan -Hold nonessential statin and fibrate while OBS status  4. Metastatic renal cancer:  -Continue Votrient  5. Adrenal insufficiency:  -Continue fludrocortisone and dexamethasone       DVT prophylaxis: Lovenox Diet: NPO after 4am for anticipated stress testing Code Status: Full  Family Communication: Wife at bedside  Disposition Plan: Anticipate overnight observation for arrhythmia on telemetry, serial troponins and subsequent risk stratification by Cardiology.  If testing negative, home after. Consults called: None overnight Admission status: Telemetry, OBS   Medical decision making: Patient seen at 4:29 AM on 11/03/2015.  The patient was discussed with Dr. Kathrynn Humble. What exists of the patient's chart and outside records from [facility] were  reviewed in depth and summarized above.  Clinical condition: stable.      Edwin Dada [facility] Pager [phone]

## 2015-11-03 NOTE — Discharge Instructions (Signed)

## 2015-11-03 NOTE — Discharge Summary (Signed)
Physician Discharge Summary  Kevin Hawkins SEG:[id number] DOB: 1946/01/02 DOA: 11/03/2015  PCP: Leonides Sake, MD  Admit date: 11/03/2015 Discharge date: 11/03/2015  Admitted From: Home Disposition:  Home   Recommendations for Outpatient Follow-up:  1. Follow up with PCP in 3-5 days 2. Please obtain BMP/CBC in one week 3. Please follow up with cardiologist  Home Health: No  Discharge Condition: Stable CODE STATUS: Full Diet recommendation: Heart healthy/carb modified  Brief/Interim Summary: 70 year old male with history of metastatic renal carcinoma followed at [facility], diabetes, hypertension, chronic diastolic heart failure, adrenal insufficiency present with chest discomfort.  Cardiac catheterization was performed about 15 years ago, no stent placement. Pharmacologic nuclear stress test was performed  approximately 1 year ago on 07/20/14 that was low risk but no ischemia.  His chest discomfort felt when walking uphill like reflux but not burning with some shortness of breath. When he sat down his pain subsided. When laying the bed, his chest pain returned and radiate to her shoulder and felt soreness in his left jaw as well.  Troponins have been normal. EKG also unremarkable.  Cardiology see patient and recommended no further evaluation. Patient at this point asymptomatic. Atypical chest pain.  Subjective:  Patient seen and examined, with wife at bedside. Patient had no complaints at this point denies chest pain, shortness of breath and dizziness. No acute events overnight. Telemetry monitor no evidence of acute coronary event.  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 Chest pain - Atypical chest pain likely related to acid reflux   - Serial troponins have been normal. Reassuring.  - Recent pharmacologic nuclear stress test reassuring with no ischemia.  - Recent echocardiogram also reassuring with normal Ef. - Cardio cleared for discharge with f/u as outpatient   2. NIDDM:  stable - Continue home medications - Follow-up with PMD  3. HTN:  stable -Continue home medications - Follow with PMD   4. Metastatic renal cancer:  -Continue Votrient - Follow-up with oncology  5. Adrenal insufficiency:  -Continue fludrocortisone and dexamethasone  Discharge Instructions  Discharge Instructions    Call MD for:  difficulty breathing, headache or visual disturbances    Complete by:  As directed    Call MD for:  extreme fatigue    Complete by:  As directed    Call MD for:  hives    Complete by:  As directed    Call MD for:  persistant dizziness or light-headedness    Complete by:  As directed    Call MD for:  persistant nausea and vomiting    Complete by:  As directed    Call MD for:  redness, tenderness, or signs of infection (pain, swelling, redness, odor or green/yellow discharge around incision site)    Complete by:  As directed    Call MD for:  severe uncontrolled pain    Complete by:  As directed    Call MD for:  temperature >100.4    Complete by:  As directed    Diet - low sodium heart healthy    Complete by:  As directed    Discharge instructions    Complete by:  As directed    Increase activity slowly  Complete by:  As directed        Medication List    STOP taking these medications   fenofibrate micronized 134 MG capsule Commonly known as:  LOFIBRA Replaced by:  fenofibrate 160 MG tablet     TAKE these medications   amLODipine 10 MG tablet Commonly known as:  NORVASC Take 10 mg by mouth daily.   aspirin 325 MG tablet Take 325 mg by mouth daily.   atorvastatin 40 MG tablet Commonly known as:   LIPITOR Take 1 tablet (40 mg total) by mouth daily. Start taking on:  11/04/2015   dexamethasone 1 MG tablet Commonly known as:  DECADRON Take 1 mg by mouth daily.   fenofibrate 160 MG tablet Take 1 tablet (160 mg total) by mouth daily. Start taking on:  11/04/2015 Replaces:  fenofibrate micronized 134 MG capsule   fludrocortisone 0.1 MG tablet Commonly known as:  FLORINEF Take 0.05 mg by mouth daily.   glipiZIDE 10 MG tablet Commonly known as:  GLUCOTROL Take 10 mg by mouth daily.   losartan 100 MG tablet Commonly known as:  COZAAR Take 100 mg by mouth daily.   magnesium oxide 400 (241.3 Mg) MG tablet Commonly known as:  MAG-OX Take 800 mg by mouth daily.   metFORMIN 500 MG 24 hr tablet Commonly known as:  GLUCOPHAGE-XR Take 500-1,000 mg by mouth 2 (two) times daily. Take 1000 mg in the morning and 500 in the evening at bedtime   nitroGLYCERIN 0.4 MG SL tablet Commonly known as:  NITROSTAT Place 1 tablet (0.4 mg total) under the tongue every 5 (five) minutes as needed for chest pain.   omeprazole 40 MG capsule Commonly known as:  PRILOSEC Take 40 mg by mouth daily.   pazopanib 200 MG tablet Commonly known as:  VOTRIENT Take 600 mg by mouth daily.   potassium chloride SA 20 MEQ tablet Commonly known as:  K-DUR,KLOR-CON Take 20 mEq by mouth daily.   vitamin B-12 100 MCG tablet Commonly known as:  CYANOCOBALAMIN Take 50 mcg by mouth daily.       No Known Allergies  Consultations: Cardiolgy -  Dr Marlou Porch   Procedures/Studies: Dg Chest 2 View  Result Date: 11/03/2015 CLINICAL DATA:  Mid chest pain tonight. EXAM: CHEST  2 VIEW COMPARISON:  Radiographs 08/31/2014, lung bases from CT abdomen 08/21/2014 FINDINGS: Heart size and mediastinal contours are normal. Mild hyperinflation is unchanged. Subpleural thickening secondary to lipomatosis as seen on prior CT. No focal airspace disease, pulmonary edema, or pleural effusion. Presumed nipple shadows are noted.  Surgical clips in the upper abdomen. IMPRESSION: No active cardiopulmonary disease. Electronically Signed   By: Jeb Levering M.D.   On: 11/03/2015 00:34      Discharge Exam: Vitals:   11/03/15 0814 11/03/15 0859  BP:  (!) 163/81  Pulse:    Resp: 13   Temp: 98.2 F (36.8 C)    Vitals:   11/03/15 0515 11/03/15 0555 11/03/15 0814 11/03/15 0859  BP: 183/82 165/87  (!) 163/81  Pulse: (!) 58 (!) 55    Resp: '21 20 13   '$ Temp:  97.9 F (36.6 C) 98.2 F (36.8 C)   TempSrc:  Oral Oral   SpO2: 98% 100% 97%   Weight:  75.6 kg (166 lb 11.2 oz)    Height:  '5\' 7"'$  (1.702 m)      General: Pt is alert, awake, not in acute distress Cardiovascular: RRR, S1/S2 +, no rubs, no gallops Respiratory: CTA bilaterally, no  wheezing, no rhonchi Abdominal: Soft, NT, ND, bowel sounds + Extremities: no edema, no cyanosis   The results of significant diagnostics from this hospitalization (including imaging, microbiology, ancillary and laboratory) are listed below for reference.     Microbiology: No results found for this or any previous visit (from the past 240 hour(s)).   Labs: BNP (last 3 results) No results for input(s): BNP in the last 8760 hours. Basic Metabolic Panel:  Recent Labs Lab 11/02/15 2352  NA 139  K 3.9  CL 104  CO2 21*  GLUCOSE 166*  BUN 15  CREATININE 1.02  CALCIUM 9.8   Liver Function Tests: No results for input(s): AST, ALT, ALKPHOS, BILITOT, PROT, ALBUMIN in the last 168 hours. No results for input(s): LIPASE, AMYLASE in the last 168 hours. No results for input(s): AMMONIA in the last 168 hours. CBC:  Recent Labs Lab 11/02/15 2352  WBC 5.9  HGB 13.4  HCT 40.0  MCV 77.7*  PLT 201   Cardiac Enzymes:  Recent Labs Lab 11/03/15 0458 11/03/15 0947  TROPONINI <0.03 <0.03   BNP: Invalid input(s): POCBNP CBG:  Recent Labs Lab 11/03/15 0739 11/03/15 1112  GLUCAP 147* 159*   D-Dimer No results for input(s): DDIMER in the last 72 hours. Hgb  A1c No results for input(s): HGBA1C in the last 72 hours. Lipid Profile No results for input(s): CHOL, HDL, LDLCALC, TRIG, CHOLHDL, LDLDIRECT in the last 72 hours. Thyroid function studies No results for input(s): TSH, T4TOTAL, T3FREE, THYROIDAB in the last 72 hours.  Invalid input(s): FREET3 Anemia work up No results for input(s): VITAMINB12, FOLATE, FERRITIN, TIBC, IRON, RETICCTPCT in the last 72 hours. Urinalysis No results found for: COLORURINE, APPEARANCEUR, LABSPEC, [place], GLUCOSEU, HGBUR, BILIRUBINUR, KETONESUR, PROTEINUR, UROBILINOGEN, NITRITE, LEUKOCYTESUR Sepsis Labs Invalid input(s): PROCALCITONIN,  WBC,  LACTICIDVEN Microbiology No results found for this or any previous visit (from the past 240 hour(s)).   Time coordinating discharge: 30 minutes  SIGNED:   Doreatha Lew, MD  [facility] 11/03/2015, 12:47 PM Pager   If 7PM-7AM, please contact night-coverage [email or web address] Password [email or web address]

## 2015-11-04 LAB — GLUCOSE, CAPILLARY: GLUCOSE-CAPILLARY: 142 mg/dL — AB (ref 65–99)

## 2015-11-11 DIAGNOSIS — I10 Essential (primary) hypertension: Secondary | ICD-10-CM | POA: Diagnosis not present

## 2015-11-11 DIAGNOSIS — E782 Mixed hyperlipidemia: Secondary | ICD-10-CM | POA: Diagnosis not present

## 2015-11-11 DIAGNOSIS — Z6826 Body mass index (BMI) 26.0-26.9, adult: Secondary | ICD-10-CM | POA: Diagnosis not present

## 2015-11-11 DIAGNOSIS — E2740 Unspecified adrenocortical insufficiency: Secondary | ICD-10-CM | POA: Diagnosis not present

## 2015-11-11 DIAGNOSIS — E1129 Type 2 diabetes mellitus with other diabetic kidney complication: Secondary | ICD-10-CM | POA: Diagnosis not present

## 2015-11-11 DIAGNOSIS — Z5309 Procedure and treatment not carried out because of other contraindication: Secondary | ICD-10-CM | POA: Diagnosis not present

## 2015-11-11 DIAGNOSIS — E538 Deficiency of other specified B group vitamins: Secondary | ICD-10-CM | POA: Diagnosis not present

## 2015-11-27 DIAGNOSIS — Z85528 Personal history of other malignant neoplasm of kidney: Secondary | ICD-10-CM | POA: Diagnosis not present

## 2015-11-27 DIAGNOSIS — E273 Drug-induced adrenocortical insufficiency: Secondary | ICD-10-CM | POA: Diagnosis not present

## 2015-11-27 DIAGNOSIS — Z79899 Other long term (current) drug therapy: Secondary | ICD-10-CM | POA: Diagnosis not present

## 2015-11-27 DIAGNOSIS — E785 Hyperlipidemia, unspecified: Secondary | ICD-10-CM | POA: Diagnosis not present

## 2015-11-27 DIAGNOSIS — E042 Nontoxic multinodular goiter: Secondary | ICD-10-CM | POA: Diagnosis not present

## 2015-11-27 DIAGNOSIS — C7971 Secondary malignant neoplasm of right adrenal gland: Secondary | ICD-10-CM | POA: Diagnosis not present

## 2015-11-27 DIAGNOSIS — I10 Essential (primary) hypertension: Secondary | ICD-10-CM | POA: Diagnosis not present

## 2015-11-27 DIAGNOSIS — K219 Gastro-esophageal reflux disease without esophagitis: Secondary | ICD-10-CM | POA: Diagnosis not present

## 2015-11-27 DIAGNOSIS — Z7982 Long term (current) use of aspirin: Secondary | ICD-10-CM | POA: Diagnosis not present

## 2015-11-27 DIAGNOSIS — Z905 Acquired absence of kidney: Secondary | ICD-10-CM | POA: Diagnosis not present

## 2015-11-27 DIAGNOSIS — C787 Secondary malignant neoplasm of liver and intrahepatic bile duct: Secondary | ICD-10-CM | POA: Diagnosis not present

## 2015-11-27 DIAGNOSIS — C642 Malignant neoplasm of left kidney, except renal pelvis: Secondary | ICD-10-CM | POA: Diagnosis not present

## 2016-01-15 DIAGNOSIS — E279 Disorder of adrenal gland, unspecified: Secondary | ICD-10-CM | POA: Diagnosis not present

## 2016-01-15 DIAGNOSIS — E273 Drug-induced adrenocortical insufficiency: Secondary | ICD-10-CM | POA: Diagnosis not present

## 2016-01-15 DIAGNOSIS — N289 Disorder of kidney and ureter, unspecified: Secondary | ICD-10-CM | POA: Diagnosis not present

## 2016-01-15 DIAGNOSIS — K869 Disease of pancreas, unspecified: Secondary | ICD-10-CM | POA: Diagnosis not present

## 2016-01-15 DIAGNOSIS — E042 Nontoxic multinodular goiter: Secondary | ICD-10-CM | POA: Diagnosis not present

## 2016-01-15 DIAGNOSIS — I10 Essential (primary) hypertension: Secondary | ICD-10-CM | POA: Diagnosis not present

## 2016-01-15 DIAGNOSIS — K769 Liver disease, unspecified: Secondary | ICD-10-CM | POA: Diagnosis not present

## 2016-01-15 DIAGNOSIS — C642 Malignant neoplasm of left kidney, except renal pelvis: Secondary | ICD-10-CM | POA: Diagnosis not present

## 2016-01-15 DIAGNOSIS — Z79899 Other long term (current) drug therapy: Secondary | ICD-10-CM | POA: Diagnosis not present

## 2016-01-15 DIAGNOSIS — Z905 Acquired absence of kidney: Secondary | ICD-10-CM | POA: Diagnosis not present

## 2016-01-15 DIAGNOSIS — Z7982 Long term (current) use of aspirin: Secondary | ICD-10-CM | POA: Diagnosis not present

## 2016-01-15 DIAGNOSIS — C649 Malignant neoplasm of unspecified kidney, except renal pelvis: Secondary | ICD-10-CM | POA: Diagnosis not present

## 2016-02-12 DIAGNOSIS — I10 Essential (primary) hypertension: Secondary | ICD-10-CM | POA: Diagnosis not present

## 2016-02-12 DIAGNOSIS — Z6826 Body mass index (BMI) 26.0-26.9, adult: Secondary | ICD-10-CM | POA: Diagnosis not present

## 2016-02-12 DIAGNOSIS — M25511 Pain in right shoulder: Secondary | ICD-10-CM | POA: Diagnosis not present

## 2016-02-12 DIAGNOSIS — E1129 Type 2 diabetes mellitus with other diabetic kidney complication: Secondary | ICD-10-CM | POA: Diagnosis not present

## 2016-02-12 DIAGNOSIS — E782 Mixed hyperlipidemia: Secondary | ICD-10-CM | POA: Diagnosis not present

## 2016-02-13 DIAGNOSIS — E782 Mixed hyperlipidemia: Secondary | ICD-10-CM | POA: Diagnosis not present

## 2016-02-13 DIAGNOSIS — I10 Essential (primary) hypertension: Secondary | ICD-10-CM | POA: Diagnosis not present

## 2016-02-26 DIAGNOSIS — Z905 Acquired absence of kidney: Secondary | ICD-10-CM | POA: Diagnosis not present

## 2016-02-26 DIAGNOSIS — R05 Cough: Secondary | ICD-10-CM | POA: Diagnosis not present

## 2016-02-26 DIAGNOSIS — R252 Cramp and spasm: Secondary | ICD-10-CM | POA: Diagnosis not present

## 2016-02-26 DIAGNOSIS — Z79899 Other long term (current) drug therapy: Secondary | ICD-10-CM | POA: Diagnosis not present

## 2016-02-26 DIAGNOSIS — E273 Drug-induced adrenocortical insufficiency: Secondary | ICD-10-CM | POA: Diagnosis not present

## 2016-02-26 DIAGNOSIS — I10 Essential (primary) hypertension: Secondary | ICD-10-CM | POA: Diagnosis not present

## 2016-02-26 DIAGNOSIS — C642 Malignant neoplasm of left kidney, except renal pelvis: Secondary | ICD-10-CM | POA: Diagnosis not present

## 2016-02-26 DIAGNOSIS — Z7982 Long term (current) use of aspirin: Secondary | ICD-10-CM | POA: Diagnosis not present

## 2016-02-26 DIAGNOSIS — E042 Nontoxic multinodular goiter: Secondary | ICD-10-CM | POA: Diagnosis not present

## 2016-02-26 DIAGNOSIS — J00 Acute nasopharyngitis [common cold]: Secondary | ICD-10-CM | POA: Diagnosis not present

## 2016-04-01 DIAGNOSIS — S46211A Strain of muscle, fascia and tendon of other parts of biceps, right arm, initial encounter: Secondary | ICD-10-CM | POA: Diagnosis not present

## 2016-04-01 DIAGNOSIS — M25511 Pain in right shoulder: Secondary | ICD-10-CM | POA: Diagnosis not present

## 2016-04-29 DIAGNOSIS — E2749 Other adrenocortical insufficiency: Secondary | ICD-10-CM | POA: Diagnosis not present

## 2016-04-29 DIAGNOSIS — C787 Secondary malignant neoplasm of liver and intrahepatic bile duct: Secondary | ICD-10-CM | POA: Diagnosis not present

## 2016-04-29 DIAGNOSIS — R252 Cramp and spasm: Secondary | ICD-10-CM | POA: Diagnosis not present

## 2016-04-29 DIAGNOSIS — Z7982 Long term (current) use of aspirin: Secondary | ICD-10-CM | POA: Diagnosis not present

## 2016-04-29 DIAGNOSIS — E273 Drug-induced adrenocortical insufficiency: Secondary | ICD-10-CM | POA: Diagnosis not present

## 2016-04-29 DIAGNOSIS — Z79899 Other long term (current) drug therapy: Secondary | ICD-10-CM | POA: Diagnosis not present

## 2016-04-29 DIAGNOSIS — Z9889 Other specified postprocedural states: Secondary | ICD-10-CM | POA: Diagnosis not present

## 2016-04-29 DIAGNOSIS — E1136 Type 2 diabetes mellitus with diabetic cataract: Secondary | ICD-10-CM | POA: Diagnosis not present

## 2016-04-29 DIAGNOSIS — Z9049 Acquired absence of other specified parts of digestive tract: Secondary | ICD-10-CM | POA: Diagnosis not present

## 2016-04-29 DIAGNOSIS — C642 Malignant neoplasm of left kidney, except renal pelvis: Secondary | ICD-10-CM | POA: Diagnosis not present

## 2016-04-29 DIAGNOSIS — I10 Essential (primary) hypertension: Secondary | ICD-10-CM | POA: Diagnosis not present

## 2016-04-29 DIAGNOSIS — E7800 Pure hypercholesterolemia, unspecified: Secondary | ICD-10-CM | POA: Diagnosis not present

## 2016-04-29 DIAGNOSIS — C649 Malignant neoplasm of unspecified kidney, except renal pelvis: Secondary | ICD-10-CM | POA: Diagnosis not present

## 2016-04-29 DIAGNOSIS — Z87891 Personal history of nicotine dependence: Secondary | ICD-10-CM | POA: Diagnosis not present

## 2016-04-29 DIAGNOSIS — Z905 Acquired absence of kidney: Secondary | ICD-10-CM | POA: Diagnosis not present

## 2016-04-29 DIAGNOSIS — E785 Hyperlipidemia, unspecified: Secondary | ICD-10-CM | POA: Diagnosis not present

## 2016-04-29 DIAGNOSIS — Z9221 Personal history of antineoplastic chemotherapy: Secondary | ICD-10-CM | POA: Diagnosis not present

## 2016-04-29 DIAGNOSIS — C7889 Secondary malignant neoplasm of other digestive organs: Secondary | ICD-10-CM | POA: Diagnosis not present

## 2016-04-29 DIAGNOSIS — E042 Nontoxic multinodular goiter: Secondary | ICD-10-CM | POA: Diagnosis not present

## 2016-04-29 DIAGNOSIS — Z7984 Long term (current) use of oral hypoglycemic drugs: Secondary | ICD-10-CM | POA: Diagnosis not present

## 2016-04-29 DIAGNOSIS — C7971 Secondary malignant neoplasm of right adrenal gland: Secondary | ICD-10-CM | POA: Diagnosis not present

## 2016-04-30 DIAGNOSIS — E785 Hyperlipidemia, unspecified: Secondary | ICD-10-CM | POA: Diagnosis not present

## 2016-04-30 DIAGNOSIS — I151 Hypertension secondary to other renal disorders: Secondary | ICD-10-CM | POA: Diagnosis not present

## 2016-04-30 DIAGNOSIS — E1122 Type 2 diabetes mellitus with diabetic chronic kidney disease: Secondary | ICD-10-CM | POA: Diagnosis not present

## 2016-04-30 DIAGNOSIS — E2740 Unspecified adrenocortical insufficiency: Secondary | ICD-10-CM | POA: Diagnosis not present

## 2016-04-30 DIAGNOSIS — E049 Nontoxic goiter, unspecified: Secondary | ICD-10-CM | POA: Diagnosis not present

## 2016-04-30 DIAGNOSIS — E042 Nontoxic multinodular goiter: Secondary | ICD-10-CM | POA: Diagnosis not present

## 2016-04-30 DIAGNOSIS — C642 Malignant neoplasm of left kidney, except renal pelvis: Secondary | ICD-10-CM | POA: Diagnosis not present

## 2016-06-04 DIAGNOSIS — Z7984 Long term (current) use of oral hypoglycemic drugs: Secondary | ICD-10-CM | POA: Diagnosis not present

## 2016-06-04 DIAGNOSIS — E119 Type 2 diabetes mellitus without complications: Secondary | ICD-10-CM | POA: Diagnosis not present

## 2016-06-04 DIAGNOSIS — H2513 Age-related nuclear cataract, bilateral: Secondary | ICD-10-CM | POA: Diagnosis not present

## 2016-06-24 DIAGNOSIS — C787 Secondary malignant neoplasm of liver and intrahepatic bile duct: Secondary | ICD-10-CM | POA: Diagnosis not present

## 2016-06-24 DIAGNOSIS — I10 Essential (primary) hypertension: Secondary | ICD-10-CM | POA: Diagnosis not present

## 2016-06-24 DIAGNOSIS — Z7982 Long term (current) use of aspirin: Secondary | ICD-10-CM | POA: Diagnosis not present

## 2016-06-24 DIAGNOSIS — E273 Drug-induced adrenocortical insufficiency: Secondary | ICD-10-CM | POA: Diagnosis not present

## 2016-06-24 DIAGNOSIS — Z9089 Acquired absence of other organs: Secondary | ICD-10-CM | POA: Diagnosis not present

## 2016-06-24 DIAGNOSIS — Z79899 Other long term (current) drug therapy: Secondary | ICD-10-CM | POA: Diagnosis not present

## 2016-06-24 DIAGNOSIS — M47812 Spondylosis without myelopathy or radiculopathy, cervical region: Secondary | ICD-10-CM | POA: Diagnosis not present

## 2016-06-24 DIAGNOSIS — C649 Malignant neoplasm of unspecified kidney, except renal pelvis: Secondary | ICD-10-CM | POA: Diagnosis not present

## 2016-06-24 DIAGNOSIS — E042 Nontoxic multinodular goiter: Secondary | ICD-10-CM | POA: Diagnosis not present

## 2016-06-24 DIAGNOSIS — C641 Malignant neoplasm of right kidney, except renal pelvis: Secondary | ICD-10-CM | POA: Diagnosis not present

## 2016-06-24 DIAGNOSIS — M25519 Pain in unspecified shoulder: Secondary | ICD-10-CM | POA: Diagnosis not present

## 2016-06-24 DIAGNOSIS — C7971 Secondary malignant neoplasm of right adrenal gland: Secondary | ICD-10-CM | POA: Diagnosis not present

## 2016-06-24 DIAGNOSIS — Z905 Acquired absence of kidney: Secondary | ICD-10-CM | POA: Diagnosis not present

## 2016-06-26 DIAGNOSIS — H40003 Preglaucoma, unspecified, bilateral: Secondary | ICD-10-CM | POA: Diagnosis not present

## 2016-06-26 DIAGNOSIS — H25811 Combined forms of age-related cataract, right eye: Secondary | ICD-10-CM | POA: Diagnosis not present

## 2016-07-23 DIAGNOSIS — E049 Nontoxic goiter, unspecified: Secondary | ICD-10-CM | POA: Diagnosis not present

## 2016-07-23 DIAGNOSIS — C7989 Secondary malignant neoplasm of other specified sites: Secondary | ICD-10-CM | POA: Diagnosis not present

## 2016-07-23 DIAGNOSIS — E119 Type 2 diabetes mellitus without complications: Secondary | ICD-10-CM | POA: Diagnosis not present

## 2016-07-23 DIAGNOSIS — E785 Hyperlipidemia, unspecified: Secondary | ICD-10-CM | POA: Diagnosis not present

## 2016-07-23 DIAGNOSIS — I10 Essential (primary) hypertension: Secondary | ICD-10-CM | POA: Diagnosis not present

## 2016-07-23 DIAGNOSIS — E2740 Unspecified adrenocortical insufficiency: Secondary | ICD-10-CM | POA: Diagnosis not present

## 2016-07-23 DIAGNOSIS — K861 Other chronic pancreatitis: Secondary | ICD-10-CM | POA: Diagnosis not present

## 2016-07-23 DIAGNOSIS — D497 Neoplasm of unspecified behavior of endocrine glands and other parts of nervous system: Secondary | ICD-10-CM | POA: Diagnosis not present

## 2016-07-23 DIAGNOSIS — C7971 Secondary malignant neoplasm of right adrenal gland: Secondary | ICD-10-CM | POA: Diagnosis not present

## 2016-07-23 DIAGNOSIS — E042 Nontoxic multinodular goiter: Secondary | ICD-10-CM | POA: Diagnosis not present

## 2016-07-23 DIAGNOSIS — C649 Malignant neoplasm of unspecified kidney, except renal pelvis: Secondary | ICD-10-CM | POA: Diagnosis not present

## 2016-07-23 DIAGNOSIS — D440 Neoplasm of uncertain behavior of thyroid gland: Secondary | ICD-10-CM | POA: Diagnosis not present

## 2016-07-23 DIAGNOSIS — C642 Malignant neoplasm of left kidney, except renal pelvis: Secondary | ICD-10-CM | POA: Diagnosis not present

## 2016-07-23 DIAGNOSIS — E273 Drug-induced adrenocortical insufficiency: Secondary | ICD-10-CM | POA: Diagnosis not present

## 2016-07-23 DIAGNOSIS — E271 Primary adrenocortical insufficiency: Secondary | ICD-10-CM | POA: Diagnosis not present

## 2016-08-05 DIAGNOSIS — C7989 Secondary malignant neoplasm of other specified sites: Secondary | ICD-10-CM | POA: Diagnosis not present

## 2016-08-05 DIAGNOSIS — Z7689 Persons encountering health services in other specified circumstances: Secondary | ICD-10-CM | POA: Diagnosis not present

## 2016-08-05 DIAGNOSIS — E119 Type 2 diabetes mellitus without complications: Secondary | ICD-10-CM | POA: Diagnosis not present

## 2016-08-05 DIAGNOSIS — I10 Essential (primary) hypertension: Secondary | ICD-10-CM | POA: Diagnosis not present

## 2016-08-05 DIAGNOSIS — E785 Hyperlipidemia, unspecified: Secondary | ICD-10-CM | POA: Diagnosis not present

## 2016-08-05 DIAGNOSIS — R9431 Abnormal electrocardiogram [ECG] [EKG]: Secondary | ICD-10-CM | POA: Diagnosis not present

## 2016-08-05 DIAGNOSIS — C649 Malignant neoplasm of unspecified kidney, except renal pelvis: Secondary | ICD-10-CM | POA: Diagnosis not present

## 2016-08-06 DIAGNOSIS — E663 Overweight: Secondary | ICD-10-CM | POA: Diagnosis not present

## 2016-08-06 DIAGNOSIS — E1129 Type 2 diabetes mellitus with other diabetic kidney complication: Secondary | ICD-10-CM | POA: Diagnosis not present

## 2016-08-06 DIAGNOSIS — I10 Essential (primary) hypertension: Secondary | ICD-10-CM | POA: Diagnosis not present

## 2016-08-06 DIAGNOSIS — Z1389 Encounter for screening for other disorder: Secondary | ICD-10-CM | POA: Diagnosis not present

## 2016-08-06 DIAGNOSIS — K219 Gastro-esophageal reflux disease without esophagitis: Secondary | ICD-10-CM | POA: Diagnosis not present

## 2016-08-06 DIAGNOSIS — E876 Hypokalemia: Secondary | ICD-10-CM | POA: Diagnosis not present

## 2016-08-06 DIAGNOSIS — Z6825 Body mass index (BMI) 25.0-25.9, adult: Secondary | ICD-10-CM | POA: Diagnosis not present

## 2016-08-06 DIAGNOSIS — E782 Mixed hyperlipidemia: Secondary | ICD-10-CM | POA: Diagnosis not present

## 2016-08-06 DIAGNOSIS — C799 Secondary malignant neoplasm of unspecified site: Secondary | ICD-10-CM | POA: Diagnosis not present

## 2016-08-06 DIAGNOSIS — Z01818 Encounter for other preprocedural examination: Secondary | ICD-10-CM | POA: Diagnosis not present

## 2016-08-10 DIAGNOSIS — N400 Enlarged prostate without lower urinary tract symptoms: Secondary | ICD-10-CM | POA: Diagnosis not present

## 2016-08-10 DIAGNOSIS — C649 Malignant neoplasm of unspecified kidney, except renal pelvis: Secondary | ICD-10-CM | POA: Diagnosis not present

## 2016-08-10 DIAGNOSIS — K219 Gastro-esophageal reflux disease without esophagitis: Secondary | ICD-10-CM | POA: Diagnosis not present

## 2016-08-10 DIAGNOSIS — Z905 Acquired absence of kidney: Secondary | ICD-10-CM | POA: Diagnosis not present

## 2016-08-10 DIAGNOSIS — E119 Type 2 diabetes mellitus without complications: Secondary | ICD-10-CM | POA: Diagnosis not present

## 2016-08-10 DIAGNOSIS — C7889 Secondary malignant neoplasm of other digestive organs: Secondary | ICD-10-CM | POA: Diagnosis not present

## 2016-08-10 DIAGNOSIS — C787 Secondary malignant neoplasm of liver and intrahepatic bile duct: Secondary | ICD-10-CM | POA: Diagnosis not present

## 2016-08-10 DIAGNOSIS — E785 Hyperlipidemia, unspecified: Secondary | ICD-10-CM | POA: Diagnosis not present

## 2016-08-10 DIAGNOSIS — Z79899 Other long term (current) drug therapy: Secondary | ICD-10-CM | POA: Diagnosis not present

## 2016-08-10 DIAGNOSIS — Y836 Removal of other organ (partial) (total) as the cause of abnormal reaction of the patient, or of later complication, without mention of misadventure at the time of the procedure: Secondary | ICD-10-CM | POA: Diagnosis not present

## 2016-08-10 DIAGNOSIS — C7989 Secondary malignant neoplasm of other specified sites: Secondary | ICD-10-CM | POA: Diagnosis not present

## 2016-08-10 DIAGNOSIS — Z87891 Personal history of nicotine dependence: Secondary | ICD-10-CM | POA: Diagnosis not present

## 2016-08-10 DIAGNOSIS — E896 Postprocedural adrenocortical (-medullary) hypofunction: Secondary | ICD-10-CM | POA: Diagnosis not present

## 2016-08-10 DIAGNOSIS — Z7984 Long term (current) use of oral hypoglycemic drugs: Secondary | ICD-10-CM | POA: Diagnosis not present

## 2016-08-10 DIAGNOSIS — C770 Secondary and unspecified malignant neoplasm of lymph nodes of head, face and neck: Secondary | ICD-10-CM | POA: Diagnosis not present

## 2016-08-10 DIAGNOSIS — Z7982 Long term (current) use of aspirin: Secondary | ICD-10-CM | POA: Diagnosis not present

## 2016-08-10 DIAGNOSIS — I10 Essential (primary) hypertension: Secondary | ICD-10-CM | POA: Diagnosis not present

## 2016-08-10 DIAGNOSIS — Z85528 Personal history of other malignant neoplasm of kidney: Secondary | ICD-10-CM | POA: Diagnosis not present

## 2016-08-11 DIAGNOSIS — E2749 Other adrenocortical insufficiency: Secondary | ICD-10-CM | POA: Diagnosis not present

## 2016-08-16 DIAGNOSIS — E119 Type 2 diabetes mellitus without complications: Secondary | ICD-10-CM | POA: Diagnosis not present

## 2016-08-16 DIAGNOSIS — R791 Abnormal coagulation profile: Secondary | ICD-10-CM | POA: Diagnosis not present

## 2016-08-16 DIAGNOSIS — X58XXXA Exposure to other specified factors, initial encounter: Secondary | ICD-10-CM | POA: Diagnosis not present

## 2016-08-16 DIAGNOSIS — R070 Pain in throat: Secondary | ICD-10-CM | POA: Diagnosis not present

## 2016-08-16 DIAGNOSIS — E890 Postprocedural hypothyroidism: Secondary | ICD-10-CM | POA: Diagnosis not present

## 2016-08-16 DIAGNOSIS — Y999 Unspecified external cause status: Secondary | ICD-10-CM | POA: Diagnosis not present

## 2016-08-16 DIAGNOSIS — T814XXA Infection following a procedure, initial encounter: Secondary | ICD-10-CM | POA: Diagnosis not present

## 2016-08-16 DIAGNOSIS — Z859 Personal history of malignant neoplasm, unspecified: Secondary | ICD-10-CM | POA: Diagnosis not present

## 2016-08-16 DIAGNOSIS — Z87891 Personal history of nicotine dependence: Secondary | ICD-10-CM | POA: Diagnosis not present

## 2016-08-16 DIAGNOSIS — R221 Localized swelling, mass and lump, neck: Secondary | ICD-10-CM | POA: Diagnosis not present

## 2016-08-16 DIAGNOSIS — C7989 Secondary malignant neoplasm of other specified sites: Secondary | ICD-10-CM | POA: Diagnosis not present

## 2016-08-16 DIAGNOSIS — R05 Cough: Secondary | ICD-10-CM | POA: Diagnosis not present

## 2016-08-16 DIAGNOSIS — C768 Malignant neoplasm of other specified ill-defined sites: Secondary | ICD-10-CM | POA: Diagnosis not present

## 2016-08-20 DIAGNOSIS — R5383 Other fatigue: Secondary | ICD-10-CM | POA: Diagnosis not present

## 2016-08-20 DIAGNOSIS — L209 Atopic dermatitis, unspecified: Secondary | ICD-10-CM | POA: Diagnosis not present

## 2016-08-20 DIAGNOSIS — J9801 Acute bronchospasm: Secondary | ICD-10-CM | POA: Diagnosis not present

## 2016-08-23 DIAGNOSIS — N289 Disorder of kidney and ureter, unspecified: Secondary | ICD-10-CM | POA: Diagnosis not present

## 2016-08-23 DIAGNOSIS — L7634 Postprocedural seroma of skin and subcutaneous tissue following other procedure: Secondary | ICD-10-CM | POA: Diagnosis not present

## 2016-08-23 DIAGNOSIS — R05 Cough: Secondary | ICD-10-CM | POA: Diagnosis not present

## 2016-08-23 DIAGNOSIS — K219 Gastro-esophageal reflux disease without esophagitis: Secondary | ICD-10-CM | POA: Diagnosis not present

## 2016-08-23 DIAGNOSIS — C649 Malignant neoplasm of unspecified kidney, except renal pelvis: Secondary | ICD-10-CM | POA: Diagnosis not present

## 2016-08-23 DIAGNOSIS — E039 Hypothyroidism, unspecified: Secondary | ICD-10-CM | POA: Diagnosis not present

## 2016-08-23 DIAGNOSIS — R1314 Dysphagia, pharyngoesophageal phase: Secondary | ICD-10-CM | POA: Diagnosis not present

## 2016-08-23 DIAGNOSIS — E7800 Pure hypercholesterolemia, unspecified: Secondary | ICD-10-CM | POA: Diagnosis not present

## 2016-08-23 DIAGNOSIS — R633 Feeding difficulties: Secondary | ICD-10-CM | POA: Diagnosis not present

## 2016-08-23 DIAGNOSIS — E890 Postprocedural hypothyroidism: Secondary | ICD-10-CM | POA: Diagnosis not present

## 2016-08-23 DIAGNOSIS — E896 Postprocedural adrenocortical (-medullary) hypofunction: Secondary | ICD-10-CM | POA: Diagnosis not present

## 2016-08-23 DIAGNOSIS — Z85850 Personal history of malignant neoplasm of thyroid: Secondary | ICD-10-CM | POA: Diagnosis not present

## 2016-08-23 DIAGNOSIS — C7989 Secondary malignant neoplasm of other specified sites: Secondary | ICD-10-CM | POA: Diagnosis not present

## 2016-08-23 DIAGNOSIS — Z85858 Personal history of malignant neoplasm of other endocrine glands: Secondary | ICD-10-CM | POA: Diagnosis not present

## 2016-08-23 DIAGNOSIS — R1319 Other dysphagia: Secondary | ICD-10-CM | POA: Diagnosis not present

## 2016-08-23 DIAGNOSIS — E2740 Unspecified adrenocortical insufficiency: Secondary | ICD-10-CM | POA: Diagnosis not present

## 2016-08-23 DIAGNOSIS — E2749 Other adrenocortical insufficiency: Secondary | ICD-10-CM | POA: Diagnosis not present

## 2016-08-23 DIAGNOSIS — Z85528 Personal history of other malignant neoplasm of kidney: Secondary | ICD-10-CM | POA: Diagnosis not present

## 2016-08-23 DIAGNOSIS — Z8589 Personal history of malignant neoplasm of other organs and systems: Secondary | ICD-10-CM | POA: Diagnosis not present

## 2016-08-23 DIAGNOSIS — C7970 Secondary malignant neoplasm of unspecified adrenal gland: Secondary | ICD-10-CM | POA: Diagnosis not present

## 2016-08-23 DIAGNOSIS — Z79899 Other long term (current) drug therapy: Secondary | ICD-10-CM | POA: Diagnosis not present

## 2016-08-23 DIAGNOSIS — K210 Gastro-esophageal reflux disease with esophagitis: Secondary | ICD-10-CM | POA: Diagnosis not present

## 2016-08-23 DIAGNOSIS — E785 Hyperlipidemia, unspecified: Secondary | ICD-10-CM | POA: Diagnosis not present

## 2016-08-23 DIAGNOSIS — E11649 Type 2 diabetes mellitus with hypoglycemia without coma: Secondary | ICD-10-CM | POA: Diagnosis not present

## 2016-08-23 DIAGNOSIS — C73 Malignant neoplasm of thyroid gland: Secondary | ICD-10-CM | POA: Diagnosis not present

## 2016-08-23 DIAGNOSIS — Z9889 Other specified postprocedural states: Secondary | ICD-10-CM | POA: Diagnosis not present

## 2016-08-23 DIAGNOSIS — E119 Type 2 diabetes mellitus without complications: Secondary | ICD-10-CM | POA: Diagnosis not present

## 2016-08-23 DIAGNOSIS — Z7984 Long term (current) use of oral hypoglycemic drugs: Secondary | ICD-10-CM | POA: Diagnosis not present

## 2016-08-23 DIAGNOSIS — E222 Syndrome of inappropriate secretion of antidiuretic hormone: Secondary | ICD-10-CM | POA: Diagnosis not present

## 2016-08-23 DIAGNOSIS — E871 Hypo-osmolality and hyponatremia: Secondary | ICD-10-CM | POA: Diagnosis not present

## 2016-08-23 DIAGNOSIS — R531 Weakness: Secondary | ICD-10-CM | POA: Diagnosis not present

## 2016-08-23 DIAGNOSIS — E1165 Type 2 diabetes mellitus with hyperglycemia: Secondary | ICD-10-CM | POA: Diagnosis not present

## 2016-08-23 DIAGNOSIS — R1310 Dysphagia, unspecified: Secondary | ICD-10-CM | POA: Diagnosis not present

## 2016-08-23 DIAGNOSIS — R1312 Dysphagia, oropharyngeal phase: Secondary | ICD-10-CM | POA: Diagnosis not present

## 2016-08-23 DIAGNOSIS — I10 Essential (primary) hypertension: Secondary | ICD-10-CM | POA: Diagnosis not present

## 2016-08-23 DIAGNOSIS — S1093XA Contusion of unspecified part of neck, initial encounter: Secondary | ICD-10-CM | POA: Diagnosis not present

## 2016-08-23 DIAGNOSIS — Z905 Acquired absence of kidney: Secondary | ICD-10-CM | POA: Diagnosis not present

## 2016-08-23 DIAGNOSIS — Z87891 Personal history of nicotine dependence: Secondary | ICD-10-CM | POA: Diagnosis not present

## 2016-08-23 DIAGNOSIS — R404 Transient alteration of awareness: Secondary | ICD-10-CM | POA: Diagnosis not present

## 2016-09-17 DIAGNOSIS — R1310 Dysphagia, unspecified: Secondary | ICD-10-CM | POA: Diagnosis not present

## 2016-09-17 DIAGNOSIS — R633 Feeding difficulties: Secondary | ICD-10-CM | POA: Diagnosis not present

## 2016-09-30 DIAGNOSIS — I10 Essential (primary) hypertension: Secondary | ICD-10-CM | POA: Diagnosis not present

## 2016-09-30 DIAGNOSIS — C7989 Secondary malignant neoplasm of other specified sites: Secondary | ICD-10-CM | POA: Diagnosis not present

## 2016-09-30 DIAGNOSIS — E273 Drug-induced adrenocortical insufficiency: Secondary | ICD-10-CM | POA: Diagnosis not present

## 2016-09-30 DIAGNOSIS — C649 Malignant neoplasm of unspecified kidney, except renal pelvis: Secondary | ICD-10-CM | POA: Diagnosis not present

## 2016-10-02 DIAGNOSIS — E890 Postprocedural hypothyroidism: Secondary | ICD-10-CM | POA: Diagnosis not present

## 2016-10-06 DIAGNOSIS — I361 Nonrheumatic tricuspid (valve) insufficiency: Secondary | ICD-10-CM | POA: Diagnosis not present

## 2016-10-06 DIAGNOSIS — R609 Edema, unspecified: Secondary | ICD-10-CM | POA: Diagnosis not present

## 2016-10-06 DIAGNOSIS — I501 Left ventricular failure: Secondary | ICD-10-CM | POA: Diagnosis not present

## 2016-10-13 DIAGNOSIS — R0602 Shortness of breath: Secondary | ICD-10-CM | POA: Diagnosis not present

## 2016-10-13 DIAGNOSIS — M7989 Other specified soft tissue disorders: Secondary | ICD-10-CM | POA: Diagnosis not present

## 2016-10-13 DIAGNOSIS — R079 Chest pain, unspecified: Secondary | ICD-10-CM | POA: Diagnosis not present

## 2016-10-13 DIAGNOSIS — R600 Localized edema: Secondary | ICD-10-CM | POA: Diagnosis not present

## 2016-10-26 DIAGNOSIS — E039 Hypothyroidism, unspecified: Secondary | ICD-10-CM | POA: Diagnosis not present

## 2016-10-26 DIAGNOSIS — I501 Left ventricular failure: Secondary | ICD-10-CM | POA: Diagnosis not present

## 2016-10-26 DIAGNOSIS — R609 Edema, unspecified: Secondary | ICD-10-CM | POA: Diagnosis not present

## 2016-10-26 DIAGNOSIS — N419 Inflammatory disease of prostate, unspecified: Secondary | ICD-10-CM | POA: Diagnosis not present

## 2016-10-26 DIAGNOSIS — E119 Type 2 diabetes mellitus without complications: Secondary | ICD-10-CM | POA: Diagnosis not present

## 2016-10-26 DIAGNOSIS — E7800 Pure hypercholesterolemia, unspecified: Secondary | ICD-10-CM | POA: Diagnosis not present

## 2016-10-26 DIAGNOSIS — I361 Nonrheumatic tricuspid (valve) insufficiency: Secondary | ICD-10-CM | POA: Diagnosis not present

## 2016-10-28 DIAGNOSIS — C7902 Secondary malignant neoplasm of left kidney and renal pelvis: Secondary | ICD-10-CM | POA: Diagnosis not present

## 2016-10-28 DIAGNOSIS — I119 Hypertensive heart disease without heart failure: Secondary | ICD-10-CM | POA: Diagnosis not present

## 2016-10-28 DIAGNOSIS — R609 Edema, unspecified: Secondary | ICD-10-CM | POA: Diagnosis not present

## 2016-10-28 DIAGNOSIS — I5032 Chronic diastolic (congestive) heart failure: Secondary | ICD-10-CM | POA: Diagnosis not present

## 2016-10-28 DIAGNOSIS — N182 Chronic kidney disease, stage 2 (mild): Secondary | ICD-10-CM | POA: Diagnosis not present

## 2016-10-28 DIAGNOSIS — E119 Type 2 diabetes mellitus without complications: Secondary | ICD-10-CM | POA: Diagnosis not present

## 2016-10-28 DIAGNOSIS — E785 Hyperlipidemia, unspecified: Secondary | ICD-10-CM | POA: Diagnosis not present

## 2016-11-04 DIAGNOSIS — C649 Malignant neoplasm of unspecified kidney, except renal pelvis: Secondary | ICD-10-CM | POA: Diagnosis not present

## 2016-11-04 DIAGNOSIS — C7971 Secondary malignant neoplasm of right adrenal gland: Secondary | ICD-10-CM | POA: Diagnosis not present

## 2016-11-04 DIAGNOSIS — I10 Essential (primary) hypertension: Secondary | ICD-10-CM | POA: Diagnosis not present

## 2016-11-04 DIAGNOSIS — E273 Drug-induced adrenocortical insufficiency: Secondary | ICD-10-CM | POA: Diagnosis not present

## 2016-11-04 DIAGNOSIS — C787 Secondary malignant neoplasm of liver and intrahepatic bile duct: Secondary | ICD-10-CM | POA: Diagnosis not present

## 2016-11-04 DIAGNOSIS — C7989 Secondary malignant neoplasm of other specified sites: Secondary | ICD-10-CM | POA: Diagnosis not present

## 2016-11-04 DIAGNOSIS — C642 Malignant neoplasm of left kidney, except renal pelvis: Secondary | ICD-10-CM | POA: Diagnosis not present

## 2016-11-04 DIAGNOSIS — R918 Other nonspecific abnormal finding of lung field: Secondary | ICD-10-CM | POA: Diagnosis not present

## 2016-11-06 DIAGNOSIS — R079 Chest pain, unspecified: Secondary | ICD-10-CM | POA: Diagnosis not present

## 2016-11-26 DIAGNOSIS — E049 Nontoxic goiter, unspecified: Secondary | ICD-10-CM | POA: Diagnosis not present

## 2016-11-26 DIAGNOSIS — C649 Malignant neoplasm of unspecified kidney, except renal pelvis: Secondary | ICD-10-CM | POA: Diagnosis not present

## 2016-11-26 DIAGNOSIS — C7989 Secondary malignant neoplasm of other specified sites: Secondary | ICD-10-CM | POA: Diagnosis not present

## 2016-11-26 DIAGNOSIS — C642 Malignant neoplasm of left kidney, except renal pelvis: Secondary | ICD-10-CM | POA: Diagnosis not present

## 2016-11-26 DIAGNOSIS — E119 Type 2 diabetes mellitus without complications: Secondary | ICD-10-CM | POA: Diagnosis not present

## 2016-11-26 DIAGNOSIS — D497 Neoplasm of unspecified behavior of endocrine glands and other parts of nervous system: Secondary | ICD-10-CM | POA: Diagnosis not present

## 2016-11-26 DIAGNOSIS — Z23 Encounter for immunization: Secondary | ICD-10-CM | POA: Diagnosis not present

## 2016-11-26 DIAGNOSIS — E1122 Type 2 diabetes mellitus with diabetic chronic kidney disease: Secondary | ICD-10-CM | POA: Diagnosis not present

## 2016-11-27 DIAGNOSIS — Z85528 Personal history of other malignant neoplasm of kidney: Secondary | ICD-10-CM | POA: Diagnosis not present

## 2016-11-27 DIAGNOSIS — R609 Edema, unspecified: Secondary | ICD-10-CM | POA: Diagnosis not present

## 2016-11-27 DIAGNOSIS — I119 Hypertensive heart disease without heart failure: Secondary | ICD-10-CM | POA: Diagnosis not present

## 2016-11-27 DIAGNOSIS — N182 Chronic kidney disease, stage 2 (mild): Secondary | ICD-10-CM | POA: Diagnosis not present

## 2016-11-27 DIAGNOSIS — I5032 Chronic diastolic (congestive) heart failure: Secondary | ICD-10-CM | POA: Diagnosis not present

## 2016-11-27 DIAGNOSIS — E119 Type 2 diabetes mellitus without complications: Secondary | ICD-10-CM | POA: Diagnosis not present

## 2016-11-27 DIAGNOSIS — C7902 Secondary malignant neoplasm of left kidney and renal pelvis: Secondary | ICD-10-CM | POA: Diagnosis not present

## 2016-11-27 DIAGNOSIS — E785 Hyperlipidemia, unspecified: Secondary | ICD-10-CM | POA: Diagnosis not present

## 2016-12-17 DIAGNOSIS — E039 Hypothyroidism, unspecified: Secondary | ICD-10-CM | POA: Diagnosis not present

## 2017-01-01 DIAGNOSIS — E119 Type 2 diabetes mellitus without complications: Secondary | ICD-10-CM | POA: Diagnosis not present

## 2017-01-01 DIAGNOSIS — E7800 Pure hypercholesterolemia, unspecified: Secondary | ICD-10-CM | POA: Diagnosis not present

## 2017-01-01 DIAGNOSIS — E039 Hypothyroidism, unspecified: Secondary | ICD-10-CM | POA: Diagnosis not present

## 2017-01-01 DIAGNOSIS — I10 Essential (primary) hypertension: Secondary | ICD-10-CM | POA: Diagnosis not present

## 2017-01-01 DIAGNOSIS — K219 Gastro-esophageal reflux disease without esophagitis: Secondary | ICD-10-CM | POA: Diagnosis not present

## 2017-01-28 DIAGNOSIS — E785 Hyperlipidemia, unspecified: Secondary | ICD-10-CM | POA: Diagnosis not present

## 2017-01-28 DIAGNOSIS — C7989 Secondary malignant neoplasm of other specified sites: Secondary | ICD-10-CM | POA: Diagnosis not present

## 2017-01-28 DIAGNOSIS — C73 Malignant neoplasm of thyroid gland: Secondary | ICD-10-CM | POA: Diagnosis not present

## 2017-01-28 DIAGNOSIS — E1136 Type 2 diabetes mellitus with diabetic cataract: Secondary | ICD-10-CM | POA: Diagnosis not present

## 2017-01-28 DIAGNOSIS — E1165 Type 2 diabetes mellitus with hyperglycemia: Secondary | ICD-10-CM | POA: Diagnosis not present

## 2017-01-28 DIAGNOSIS — E7800 Pure hypercholesterolemia, unspecified: Secondary | ICD-10-CM | POA: Diagnosis not present

## 2017-01-28 DIAGNOSIS — E273 Drug-induced adrenocortical insufficiency: Secondary | ICD-10-CM | POA: Diagnosis not present

## 2017-01-28 DIAGNOSIS — I10 Essential (primary) hypertension: Secondary | ICD-10-CM | POA: Diagnosis not present

## 2017-01-28 DIAGNOSIS — Z7982 Long term (current) use of aspirin: Secondary | ICD-10-CM | POA: Diagnosis not present

## 2017-01-28 DIAGNOSIS — Z794 Long term (current) use of insulin: Secondary | ICD-10-CM | POA: Diagnosis not present

## 2017-01-28 DIAGNOSIS — E042 Nontoxic multinodular goiter: Secondary | ICD-10-CM | POA: Diagnosis not present

## 2017-01-28 DIAGNOSIS — E271 Primary adrenocortical insufficiency: Secondary | ICD-10-CM | POA: Diagnosis not present

## 2017-01-28 DIAGNOSIS — Z87891 Personal history of nicotine dependence: Secondary | ICD-10-CM | POA: Diagnosis not present

## 2017-01-28 DIAGNOSIS — C649 Malignant neoplasm of unspecified kidney, except renal pelvis: Secondary | ICD-10-CM | POA: Diagnosis not present

## 2017-01-28 DIAGNOSIS — E119 Type 2 diabetes mellitus without complications: Secondary | ICD-10-CM | POA: Diagnosis not present

## 2017-01-28 DIAGNOSIS — Z905 Acquired absence of kidney: Secondary | ICD-10-CM | POA: Diagnosis not present

## 2017-01-28 DIAGNOSIS — Z79899 Other long term (current) drug therapy: Secondary | ICD-10-CM | POA: Diagnosis not present

## 2017-02-17 DIAGNOSIS — C7989 Secondary malignant neoplasm of other specified sites: Secondary | ICD-10-CM | POA: Diagnosis not present

## 2017-02-17 DIAGNOSIS — C7971 Secondary malignant neoplasm of right adrenal gland: Secondary | ICD-10-CM | POA: Diagnosis not present

## 2017-02-17 DIAGNOSIS — C787 Secondary malignant neoplasm of liver and intrahepatic bile duct: Secondary | ICD-10-CM | POA: Diagnosis not present

## 2017-02-17 DIAGNOSIS — E2740 Unspecified adrenocortical insufficiency: Secondary | ICD-10-CM | POA: Diagnosis not present

## 2017-02-17 DIAGNOSIS — I10 Essential (primary) hypertension: Secondary | ICD-10-CM | POA: Diagnosis not present

## 2017-02-17 DIAGNOSIS — E876 Hypokalemia: Secondary | ICD-10-CM | POA: Diagnosis not present

## 2017-02-17 DIAGNOSIS — R531 Weakness: Secondary | ICD-10-CM | POA: Diagnosis not present

## 2017-02-17 DIAGNOSIS — C642 Malignant neoplasm of left kidney, except renal pelvis: Secondary | ICD-10-CM | POA: Diagnosis not present

## 2017-02-17 DIAGNOSIS — R5383 Other fatigue: Secondary | ICD-10-CM | POA: Diagnosis not present

## 2017-02-17 DIAGNOSIS — R918 Other nonspecific abnormal finding of lung field: Secondary | ICD-10-CM | POA: Diagnosis not present

## 2017-03-26 DIAGNOSIS — H25811 Combined forms of age-related cataract, right eye: Secondary | ICD-10-CM | POA: Diagnosis not present

## 2017-04-02 DIAGNOSIS — R748 Abnormal levels of other serum enzymes: Secondary | ICD-10-CM | POA: Diagnosis not present

## 2017-04-02 DIAGNOSIS — Z125 Encounter for screening for malignant neoplasm of prostate: Secondary | ICD-10-CM | POA: Diagnosis not present

## 2017-04-02 DIAGNOSIS — Z139 Encounter for screening, unspecified: Secondary | ICD-10-CM | POA: Diagnosis not present

## 2017-04-02 DIAGNOSIS — I10 Essential (primary) hypertension: Secondary | ICD-10-CM | POA: Diagnosis not present

## 2017-04-02 DIAGNOSIS — E538 Deficiency of other specified B group vitamins: Secondary | ICD-10-CM | POA: Diagnosis not present

## 2017-04-02 DIAGNOSIS — E039 Hypothyroidism, unspecified: Secondary | ICD-10-CM | POA: Diagnosis not present

## 2017-04-02 DIAGNOSIS — N419 Inflammatory disease of prostate, unspecified: Secondary | ICD-10-CM | POA: Diagnosis not present

## 2017-04-02 DIAGNOSIS — E7800 Pure hypercholesterolemia, unspecified: Secondary | ICD-10-CM | POA: Diagnosis not present

## 2017-04-02 DIAGNOSIS — E119 Type 2 diabetes mellitus without complications: Secondary | ICD-10-CM | POA: Diagnosis not present

## 2017-04-02 DIAGNOSIS — Z13220 Encounter for screening for lipoid disorders: Secondary | ICD-10-CM | POA: Diagnosis not present

## 2017-04-02 DIAGNOSIS — Z8349 Family history of other endocrine, nutritional and metabolic diseases: Secondary | ICD-10-CM | POA: Diagnosis not present

## 2017-04-02 DIAGNOSIS — J020 Streptococcal pharyngitis: Secondary | ICD-10-CM | POA: Diagnosis not present

## 2017-04-02 DIAGNOSIS — Z136 Encounter for screening for cardiovascular disorders: Secondary | ICD-10-CM | POA: Diagnosis not present

## 2017-04-08 DIAGNOSIS — Z794 Long term (current) use of insulin: Secondary | ICD-10-CM | POA: Diagnosis not present

## 2017-04-08 DIAGNOSIS — C7971 Secondary malignant neoplasm of right adrenal gland: Secondary | ICD-10-CM | POA: Diagnosis not present

## 2017-04-08 DIAGNOSIS — C7989 Secondary malignant neoplasm of other specified sites: Secondary | ICD-10-CM | POA: Diagnosis not present

## 2017-04-08 DIAGNOSIS — C787 Secondary malignant neoplasm of liver and intrahepatic bile duct: Secondary | ICD-10-CM | POA: Diagnosis not present

## 2017-04-08 DIAGNOSIS — C73 Malignant neoplasm of thyroid gland: Secondary | ICD-10-CM | POA: Diagnosis not present

## 2017-04-08 DIAGNOSIS — I10 Essential (primary) hypertension: Secondary | ICD-10-CM | POA: Diagnosis not present

## 2017-04-08 DIAGNOSIS — N401 Enlarged prostate with lower urinary tract symptoms: Secondary | ICD-10-CM | POA: Diagnosis not present

## 2017-04-08 DIAGNOSIS — E2740 Unspecified adrenocortical insufficiency: Secondary | ICD-10-CM | POA: Diagnosis not present

## 2017-04-08 DIAGNOSIS — D497 Neoplasm of unspecified behavior of endocrine glands and other parts of nervous system: Secondary | ICD-10-CM | POA: Diagnosis not present

## 2017-04-08 DIAGNOSIS — Z79899 Other long term (current) drug therapy: Secondary | ICD-10-CM | POA: Diagnosis not present

## 2017-04-08 DIAGNOSIS — Z9221 Personal history of antineoplastic chemotherapy: Secondary | ICD-10-CM | POA: Diagnosis not present

## 2017-04-08 DIAGNOSIS — Z905 Acquired absence of kidney: Secondary | ICD-10-CM | POA: Diagnosis not present

## 2017-04-08 DIAGNOSIS — Z85528 Personal history of other malignant neoplasm of kidney: Secondary | ICD-10-CM | POA: Diagnosis not present

## 2017-04-08 DIAGNOSIS — N138 Other obstructive and reflux uropathy: Secondary | ICD-10-CM | POA: Diagnosis not present

## 2017-04-08 DIAGNOSIS — E042 Nontoxic multinodular goiter: Secondary | ICD-10-CM | POA: Diagnosis not present

## 2017-04-08 DIAGNOSIS — E1165 Type 2 diabetes mellitus with hyperglycemia: Secondary | ICD-10-CM | POA: Diagnosis not present

## 2017-04-08 DIAGNOSIS — Z87891 Personal history of nicotine dependence: Secondary | ICD-10-CM | POA: Diagnosis not present

## 2017-04-08 DIAGNOSIS — C649 Malignant neoplasm of unspecified kidney, except renal pelvis: Secondary | ICD-10-CM | POA: Diagnosis not present

## 2017-04-08 DIAGNOSIS — E271 Primary adrenocortical insufficiency: Secondary | ICD-10-CM | POA: Diagnosis not present

## 2017-04-13 DIAGNOSIS — Z87891 Personal history of nicotine dependence: Secondary | ICD-10-CM | POA: Diagnosis not present

## 2017-04-13 DIAGNOSIS — M1990 Unspecified osteoarthritis, unspecified site: Secondary | ICD-10-CM | POA: Diagnosis not present

## 2017-04-13 DIAGNOSIS — H259 Unspecified age-related cataract: Secondary | ICD-10-CM | POA: Diagnosis not present

## 2017-04-13 DIAGNOSIS — Z79899 Other long term (current) drug therapy: Secondary | ICD-10-CM | POA: Diagnosis not present

## 2017-04-13 DIAGNOSIS — K219 Gastro-esophageal reflux disease without esophagitis: Secondary | ICD-10-CM | POA: Diagnosis not present

## 2017-04-13 DIAGNOSIS — E7800 Pure hypercholesterolemia, unspecified: Secondary | ICD-10-CM | POA: Diagnosis not present

## 2017-04-13 DIAGNOSIS — E119 Type 2 diabetes mellitus without complications: Secondary | ICD-10-CM | POA: Diagnosis not present

## 2017-04-13 DIAGNOSIS — Z794 Long term (current) use of insulin: Secondary | ICD-10-CM | POA: Diagnosis not present

## 2017-04-13 DIAGNOSIS — Z7982 Long term (current) use of aspirin: Secondary | ICD-10-CM | POA: Diagnosis not present

## 2017-04-13 DIAGNOSIS — E039 Hypothyroidism, unspecified: Secondary | ICD-10-CM | POA: Diagnosis not present

## 2017-04-13 DIAGNOSIS — H25811 Combined forms of age-related cataract, right eye: Secondary | ICD-10-CM | POA: Diagnosis not present

## 2017-04-13 DIAGNOSIS — I10 Essential (primary) hypertension: Secondary | ICD-10-CM | POA: Diagnosis not present

## 2017-04-14 DIAGNOSIS — H25811 Combined forms of age-related cataract, right eye: Secondary | ICD-10-CM | POA: Diagnosis not present

## 2017-04-26 DIAGNOSIS — K219 Gastro-esophageal reflux disease without esophagitis: Secondary | ICD-10-CM | POA: Diagnosis not present

## 2017-04-26 DIAGNOSIS — E118 Type 2 diabetes mellitus with unspecified complications: Secondary | ICD-10-CM | POA: Diagnosis not present

## 2017-04-26 DIAGNOSIS — E039 Hypothyroidism, unspecified: Secondary | ICD-10-CM | POA: Diagnosis not present

## 2017-04-26 DIAGNOSIS — C800 Disseminated malignant neoplasm, unspecified: Secondary | ICD-10-CM | POA: Diagnosis not present

## 2017-05-11 DIAGNOSIS — Z85840 Personal history of malignant neoplasm of eye: Secondary | ICD-10-CM | POA: Diagnosis not present

## 2017-05-11 DIAGNOSIS — Z85528 Personal history of other malignant neoplasm of kidney: Secondary | ICD-10-CM | POA: Diagnosis not present

## 2017-05-11 DIAGNOSIS — Z85850 Personal history of malignant neoplasm of thyroid: Secondary | ICD-10-CM | POA: Diagnosis not present

## 2017-05-11 DIAGNOSIS — E669 Obesity, unspecified: Secondary | ICD-10-CM | POA: Diagnosis not present

## 2017-05-11 DIAGNOSIS — Z79899 Other long term (current) drug therapy: Secondary | ICD-10-CM | POA: Diagnosis not present

## 2017-05-11 DIAGNOSIS — I10 Essential (primary) hypertension: Secondary | ICD-10-CM | POA: Diagnosis not present

## 2017-05-11 DIAGNOSIS — M1990 Unspecified osteoarthritis, unspecified site: Secondary | ICD-10-CM | POA: Diagnosis not present

## 2017-05-11 DIAGNOSIS — E119 Type 2 diabetes mellitus without complications: Secondary | ICD-10-CM | POA: Diagnosis not present

## 2017-05-11 DIAGNOSIS — H25812 Combined forms of age-related cataract, left eye: Secondary | ICD-10-CM | POA: Diagnosis not present

## 2017-05-11 DIAGNOSIS — Z8507 Personal history of malignant neoplasm of pancreas: Secondary | ICD-10-CM | POA: Diagnosis not present

## 2017-05-11 DIAGNOSIS — H259 Unspecified age-related cataract: Secondary | ICD-10-CM | POA: Diagnosis not present

## 2017-05-11 DIAGNOSIS — Z794 Long term (current) use of insulin: Secondary | ICD-10-CM | POA: Diagnosis not present

## 2017-05-11 DIAGNOSIS — K219 Gastro-esophageal reflux disease without esophagitis: Secondary | ICD-10-CM | POA: Diagnosis not present

## 2017-05-11 DIAGNOSIS — Z905 Acquired absence of kidney: Secondary | ICD-10-CM | POA: Diagnosis not present

## 2017-06-23 DIAGNOSIS — C7971 Secondary malignant neoplasm of right adrenal gland: Secondary | ICD-10-CM | POA: Diagnosis not present

## 2017-06-23 DIAGNOSIS — K7689 Other specified diseases of liver: Secondary | ICD-10-CM | POA: Diagnosis not present

## 2017-06-23 DIAGNOSIS — I10 Essential (primary) hypertension: Secondary | ICD-10-CM | POA: Diagnosis not present

## 2017-06-23 DIAGNOSIS — C642 Malignant neoplasm of left kidney, except renal pelvis: Secondary | ICD-10-CM | POA: Diagnosis not present

## 2017-06-23 DIAGNOSIS — E273 Drug-induced adrenocortical insufficiency: Secondary | ICD-10-CM | POA: Diagnosis not present

## 2017-06-23 DIAGNOSIS — E890 Postprocedural hypothyroidism: Secondary | ICD-10-CM | POA: Diagnosis not present

## 2017-06-23 DIAGNOSIS — R918 Other nonspecific abnormal finding of lung field: Secondary | ICD-10-CM | POA: Diagnosis not present

## 2017-06-23 DIAGNOSIS — E2740 Unspecified adrenocortical insufficiency: Secondary | ICD-10-CM | POA: Diagnosis not present

## 2017-06-23 DIAGNOSIS — C787 Secondary malignant neoplasm of liver and intrahepatic bile duct: Secondary | ICD-10-CM | POA: Diagnosis not present

## 2017-06-24 DIAGNOSIS — E118 Type 2 diabetes mellitus with unspecified complications: Secondary | ICD-10-CM | POA: Diagnosis not present

## 2017-06-24 DIAGNOSIS — E039 Hypothyroidism, unspecified: Secondary | ICD-10-CM | POA: Diagnosis not present

## 2017-06-24 DIAGNOSIS — N419 Inflammatory disease of prostate, unspecified: Secondary | ICD-10-CM | POA: Diagnosis not present

## 2017-06-24 DIAGNOSIS — Z85850 Personal history of malignant neoplasm of thyroid: Secondary | ICD-10-CM | POA: Diagnosis not present

## 2017-06-24 DIAGNOSIS — I10 Essential (primary) hypertension: Secondary | ICD-10-CM | POA: Diagnosis not present

## 2017-06-24 DIAGNOSIS — E119 Type 2 diabetes mellitus without complications: Secondary | ICD-10-CM | POA: Diagnosis not present

## 2017-08-26 DIAGNOSIS — Z1339 Encounter for screening examination for other mental health and behavioral disorders: Secondary | ICD-10-CM | POA: Diagnosis not present

## 2017-08-26 DIAGNOSIS — I998 Other disorder of circulatory system: Secondary | ICD-10-CM | POA: Diagnosis not present

## 2017-08-26 DIAGNOSIS — I10 Essential (primary) hypertension: Secondary | ICD-10-CM | POA: Diagnosis not present

## 2017-08-26 DIAGNOSIS — K219 Gastro-esophageal reflux disease without esophagitis: Secondary | ICD-10-CM | POA: Diagnosis not present

## 2017-08-26 DIAGNOSIS — E876 Hypokalemia: Secondary | ICD-10-CM | POA: Diagnosis not present

## 2017-08-26 DIAGNOSIS — C799 Secondary malignant neoplasm of unspecified site: Secondary | ICD-10-CM | POA: Diagnosis not present

## 2017-08-26 DIAGNOSIS — Z139 Encounter for screening, unspecified: Secondary | ICD-10-CM | POA: Diagnosis not present

## 2017-08-26 DIAGNOSIS — Z1331 Encounter for screening for depression: Secondary | ICD-10-CM | POA: Diagnosis not present

## 2017-08-26 DIAGNOSIS — Z6825 Body mass index (BMI) 25.0-25.9, adult: Secondary | ICD-10-CM | POA: Diagnosis not present

## 2017-08-26 DIAGNOSIS — E782 Mixed hyperlipidemia: Secondary | ICD-10-CM | POA: Diagnosis not present

## 2017-08-26 DIAGNOSIS — E1129 Type 2 diabetes mellitus with other diabetic kidney complication: Secondary | ICD-10-CM | POA: Diagnosis not present

## 2017-08-26 DIAGNOSIS — Z9181 History of falling: Secondary | ICD-10-CM | POA: Diagnosis not present

## 2017-09-14 DIAGNOSIS — Z7984 Long term (current) use of oral hypoglycemic drugs: Secondary | ICD-10-CM | POA: Diagnosis not present

## 2017-09-14 DIAGNOSIS — E896 Postprocedural adrenocortical (-medullary) hypofunction: Secondary | ICD-10-CM | POA: Diagnosis not present

## 2017-09-14 DIAGNOSIS — C7901 Secondary malignant neoplasm of right kidney and renal pelvis: Secondary | ICD-10-CM | POA: Diagnosis not present

## 2017-09-14 DIAGNOSIS — Z905 Acquired absence of kidney: Secondary | ICD-10-CM | POA: Diagnosis not present

## 2017-09-14 DIAGNOSIS — C787 Secondary malignant neoplasm of liver and intrahepatic bile duct: Secondary | ICD-10-CM | POA: Diagnosis not present

## 2017-09-14 DIAGNOSIS — Z79890 Hormone replacement therapy (postmenopausal): Secondary | ICD-10-CM | POA: Diagnosis not present

## 2017-09-14 DIAGNOSIS — C7889 Secondary malignant neoplasm of other digestive organs: Secondary | ICD-10-CM | POA: Diagnosis not present

## 2017-09-14 DIAGNOSIS — E273 Drug-induced adrenocortical insufficiency: Secondary | ICD-10-CM | POA: Diagnosis not present

## 2017-09-14 DIAGNOSIS — E348 Other specified endocrine disorders: Secondary | ICD-10-CM | POA: Diagnosis not present

## 2017-09-14 DIAGNOSIS — C7989 Secondary malignant neoplasm of other specified sites: Secondary | ICD-10-CM | POA: Diagnosis not present

## 2017-09-14 DIAGNOSIS — C642 Malignant neoplasm of left kidney, except renal pelvis: Secondary | ICD-10-CM | POA: Diagnosis not present

## 2017-09-14 DIAGNOSIS — E1165 Type 2 diabetes mellitus with hyperglycemia: Secondary | ICD-10-CM | POA: Diagnosis not present

## 2017-09-14 DIAGNOSIS — E2740 Unspecified adrenocortical insufficiency: Secondary | ICD-10-CM | POA: Diagnosis not present

## 2017-09-14 DIAGNOSIS — Z79899 Other long term (current) drug therapy: Secondary | ICD-10-CM | POA: Diagnosis not present

## 2017-09-14 DIAGNOSIS — C649 Malignant neoplasm of unspecified kidney, except renal pelvis: Secondary | ICD-10-CM | POA: Diagnosis not present

## 2017-09-27 DIAGNOSIS — E1129 Type 2 diabetes mellitus with other diabetic kidney complication: Secondary | ICD-10-CM | POA: Diagnosis not present

## 2017-09-27 DIAGNOSIS — Z6825 Body mass index (BMI) 25.0-25.9, adult: Secondary | ICD-10-CM | POA: Diagnosis not present

## 2017-09-27 DIAGNOSIS — E663 Overweight: Secondary | ICD-10-CM | POA: Diagnosis not present

## 2017-09-27 DIAGNOSIS — T7840XA Allergy, unspecified, initial encounter: Secondary | ICD-10-CM | POA: Diagnosis not present

## 2017-09-27 DIAGNOSIS — L298 Other pruritus: Secondary | ICD-10-CM | POA: Diagnosis not present

## 2017-10-01 DIAGNOSIS — L298 Other pruritus: Secondary | ICD-10-CM | POA: Diagnosis not present

## 2017-10-01 DIAGNOSIS — E1129 Type 2 diabetes mellitus with other diabetic kidney complication: Secondary | ICD-10-CM | POA: Diagnosis not present

## 2017-10-01 DIAGNOSIS — B889 Infestation, unspecified: Secondary | ICD-10-CM | POA: Diagnosis not present

## 2017-10-01 DIAGNOSIS — Z6826 Body mass index (BMI) 26.0-26.9, adult: Secondary | ICD-10-CM | POA: Diagnosis not present

## 2017-10-27 DIAGNOSIS — Z794 Long term (current) use of insulin: Secondary | ICD-10-CM | POA: Diagnosis not present

## 2017-10-27 DIAGNOSIS — E1165 Type 2 diabetes mellitus with hyperglycemia: Secondary | ICD-10-CM | POA: Diagnosis not present

## 2017-10-27 DIAGNOSIS — C649 Malignant neoplasm of unspecified kidney, except renal pelvis: Secondary | ICD-10-CM | POA: Diagnosis not present

## 2017-10-27 DIAGNOSIS — I10 Essential (primary) hypertension: Secondary | ICD-10-CM | POA: Diagnosis not present

## 2017-10-27 DIAGNOSIS — E278 Other specified disorders of adrenal gland: Secondary | ICD-10-CM | POA: Diagnosis not present

## 2017-10-27 DIAGNOSIS — Z905 Acquired absence of kidney: Secondary | ICD-10-CM | POA: Diagnosis not present

## 2017-10-27 DIAGNOSIS — Z9089 Acquired absence of other organs: Secondary | ICD-10-CM | POA: Diagnosis not present

## 2017-10-27 DIAGNOSIS — E273 Drug-induced adrenocortical insufficiency: Secondary | ICD-10-CM | POA: Diagnosis not present

## 2017-10-27 DIAGNOSIS — Z79899 Other long term (current) drug therapy: Secondary | ICD-10-CM | POA: Diagnosis not present

## 2017-10-27 DIAGNOSIS — N3289 Other specified disorders of bladder: Secondary | ICD-10-CM | POA: Diagnosis not present

## 2017-10-27 DIAGNOSIS — E0789 Other specified disorders of thyroid: Secondary | ICD-10-CM | POA: Diagnosis not present

## 2017-10-27 DIAGNOSIS — K8689 Other specified diseases of pancreas: Secondary | ICD-10-CM | POA: Diagnosis not present

## 2017-10-27 DIAGNOSIS — C642 Malignant neoplasm of left kidney, except renal pelvis: Secondary | ICD-10-CM | POA: Diagnosis not present

## 2017-10-27 DIAGNOSIS — K769 Liver disease, unspecified: Secondary | ICD-10-CM | POA: Diagnosis not present

## 2017-10-27 DIAGNOSIS — M545 Low back pain: Secondary | ICD-10-CM | POA: Diagnosis not present

## 2017-10-27 DIAGNOSIS — E1140 Type 2 diabetes mellitus with diabetic neuropathy, unspecified: Secondary | ICD-10-CM | POA: Diagnosis not present

## 2017-10-27 DIAGNOSIS — E039 Hypothyroidism, unspecified: Secondary | ICD-10-CM | POA: Diagnosis not present

## 2017-10-27 DIAGNOSIS — M25551 Pain in right hip: Secondary | ICD-10-CM | POA: Diagnosis not present

## 2017-10-27 DIAGNOSIS — C7989 Secondary malignant neoplasm of other specified sites: Secondary | ICD-10-CM | POA: Diagnosis not present

## 2017-11-01 DIAGNOSIS — Z23 Encounter for immunization: Secondary | ICD-10-CM | POA: Diagnosis not present

## 2017-11-01 DIAGNOSIS — E1129 Type 2 diabetes mellitus with other diabetic kidney complication: Secondary | ICD-10-CM | POA: Diagnosis not present

## 2017-11-01 DIAGNOSIS — C641 Malignant neoplasm of right kidney, except renal pelvis: Secondary | ICD-10-CM | POA: Diagnosis not present

## 2017-11-01 DIAGNOSIS — E11649 Type 2 diabetes mellitus with hypoglycemia without coma: Secondary | ICD-10-CM | POA: Diagnosis not present

## 2017-11-01 DIAGNOSIS — Z6825 Body mass index (BMI) 25.0-25.9, adult: Secondary | ICD-10-CM | POA: Diagnosis not present

## 2017-11-24 DIAGNOSIS — I10 Essential (primary) hypertension: Secondary | ICD-10-CM | POA: Diagnosis not present

## 2017-11-24 DIAGNOSIS — E039 Hypothyroidism, unspecified: Secondary | ICD-10-CM | POA: Diagnosis not present

## 2017-11-24 DIAGNOSIS — E119 Type 2 diabetes mellitus without complications: Secondary | ICD-10-CM | POA: Diagnosis not present

## 2017-11-24 DIAGNOSIS — Z79899 Other long term (current) drug therapy: Secondary | ICD-10-CM | POA: Diagnosis not present

## 2017-11-24 DIAGNOSIS — Z905 Acquired absence of kidney: Secondary | ICD-10-CM | POA: Diagnosis not present

## 2017-11-24 DIAGNOSIS — E2749 Other adrenocortical insufficiency: Secondary | ICD-10-CM | POA: Diagnosis not present

## 2017-11-24 DIAGNOSIS — C649 Malignant neoplasm of unspecified kidney, except renal pelvis: Secondary | ICD-10-CM | POA: Diagnosis not present

## 2017-11-24 DIAGNOSIS — C641 Malignant neoplasm of right kidney, except renal pelvis: Secondary | ICD-10-CM | POA: Diagnosis not present

## 2017-12-06 DIAGNOSIS — E1129 Type 2 diabetes mellitus with other diabetic kidney complication: Secondary | ICD-10-CM | POA: Diagnosis not present

## 2017-12-06 DIAGNOSIS — E2740 Unspecified adrenocortical insufficiency: Secondary | ICD-10-CM | POA: Diagnosis not present

## 2017-12-06 DIAGNOSIS — I10 Essential (primary) hypertension: Secondary | ICD-10-CM | POA: Diagnosis not present

## 2017-12-06 DIAGNOSIS — E1142 Type 2 diabetes mellitus with diabetic polyneuropathy: Secondary | ICD-10-CM | POA: Diagnosis not present

## 2017-12-06 DIAGNOSIS — Z6825 Body mass index (BMI) 25.0-25.9, adult: Secondary | ICD-10-CM | POA: Diagnosis not present

## 2017-12-06 DIAGNOSIS — C641 Malignant neoplasm of right kidney, except renal pelvis: Secondary | ICD-10-CM | POA: Diagnosis not present

## 2018-01-03 DIAGNOSIS — Z794 Long term (current) use of insulin: Secondary | ICD-10-CM | POA: Diagnosis not present

## 2018-01-03 DIAGNOSIS — E042 Nontoxic multinodular goiter: Secondary | ICD-10-CM | POA: Diagnosis not present

## 2018-01-03 DIAGNOSIS — C7989 Secondary malignant neoplasm of other specified sites: Secondary | ICD-10-CM | POA: Diagnosis not present

## 2018-01-03 DIAGNOSIS — E119 Type 2 diabetes mellitus without complications: Secondary | ICD-10-CM | POA: Diagnosis not present

## 2018-01-03 DIAGNOSIS — E039 Hypothyroidism, unspecified: Secondary | ICD-10-CM | POA: Diagnosis not present

## 2018-01-03 DIAGNOSIS — E271 Primary adrenocortical insufficiency: Secondary | ICD-10-CM | POA: Diagnosis not present

## 2018-01-03 DIAGNOSIS — C649 Malignant neoplasm of unspecified kidney, except renal pelvis: Secondary | ICD-10-CM | POA: Diagnosis not present

## 2018-01-03 DIAGNOSIS — Z9089 Acquired absence of other organs: Secondary | ICD-10-CM | POA: Diagnosis not present

## 2018-01-26 DIAGNOSIS — E119 Type 2 diabetes mellitus without complications: Secondary | ICD-10-CM | POA: Diagnosis not present

## 2018-01-26 DIAGNOSIS — Z79899 Other long term (current) drug therapy: Secondary | ICD-10-CM | POA: Diagnosis not present

## 2018-01-26 DIAGNOSIS — I10 Essential (primary) hypertension: Secondary | ICD-10-CM | POA: Diagnosis not present

## 2018-01-26 DIAGNOSIS — E039 Hypothyroidism, unspecified: Secondary | ICD-10-CM | POA: Diagnosis not present

## 2018-01-26 DIAGNOSIS — C649 Malignant neoplasm of unspecified kidney, except renal pelvis: Secondary | ICD-10-CM | POA: Diagnosis not present

## 2018-01-26 DIAGNOSIS — J9811 Atelectasis: Secondary | ICD-10-CM | POA: Diagnosis not present

## 2018-01-26 DIAGNOSIS — K7689 Other specified diseases of liver: Secondary | ICD-10-CM | POA: Diagnosis not present

## 2018-01-26 DIAGNOSIS — K8689 Other specified diseases of pancreas: Secondary | ICD-10-CM | POA: Diagnosis not present

## 2018-01-26 DIAGNOSIS — Z794 Long term (current) use of insulin: Secondary | ICD-10-CM | POA: Diagnosis not present

## 2018-01-26 DIAGNOSIS — E273 Drug-induced adrenocortical insufficiency: Secondary | ICD-10-CM | POA: Diagnosis not present

## 2018-01-26 DIAGNOSIS — K869 Disease of pancreas, unspecified: Secondary | ICD-10-CM | POA: Diagnosis not present

## 2018-03-17 DIAGNOSIS — E039 Hypothyroidism, unspecified: Secondary | ICD-10-CM | POA: Diagnosis not present

## 2018-03-17 DIAGNOSIS — I10 Essential (primary) hypertension: Secondary | ICD-10-CM | POA: Diagnosis not present

## 2018-03-17 DIAGNOSIS — Z8553 Personal history of malignant neoplasm of renal pelvis: Secondary | ICD-10-CM | POA: Diagnosis not present

## 2018-03-17 DIAGNOSIS — E1142 Type 2 diabetes mellitus with diabetic polyneuropathy: Secondary | ICD-10-CM | POA: Diagnosis not present

## 2018-03-17 DIAGNOSIS — E538 Deficiency of other specified B group vitamins: Secondary | ICD-10-CM | POA: Diagnosis not present

## 2018-03-17 DIAGNOSIS — Z6825 Body mass index (BMI) 25.0-25.9, adult: Secondary | ICD-10-CM | POA: Diagnosis not present

## 2018-03-17 DIAGNOSIS — E785 Hyperlipidemia, unspecified: Secondary | ICD-10-CM | POA: Diagnosis not present

## 2018-04-27 DIAGNOSIS — Z905 Acquired absence of kidney: Secondary | ICD-10-CM | POA: Diagnosis not present

## 2018-04-27 DIAGNOSIS — I10 Essential (primary) hypertension: Secondary | ICD-10-CM | POA: Diagnosis not present

## 2018-04-27 DIAGNOSIS — Z79899 Other long term (current) drug therapy: Secondary | ICD-10-CM | POA: Diagnosis not present

## 2018-04-27 DIAGNOSIS — C649 Malignant neoplasm of unspecified kidney, except renal pelvis: Secondary | ICD-10-CM | POA: Diagnosis not present

## 2018-04-27 DIAGNOSIS — R918 Other nonspecific abnormal finding of lung field: Secondary | ICD-10-CM | POA: Diagnosis not present

## 2018-04-27 DIAGNOSIS — C7989 Secondary malignant neoplasm of other specified sites: Secondary | ICD-10-CM | POA: Diagnosis not present

## 2018-04-27 DIAGNOSIS — E039 Hypothyroidism, unspecified: Secondary | ICD-10-CM | POA: Diagnosis not present

## 2018-04-27 DIAGNOSIS — E273 Drug-induced adrenocortical insufficiency: Secondary | ICD-10-CM | POA: Diagnosis not present

## 2018-04-27 DIAGNOSIS — E890 Postprocedural hypothyroidism: Secondary | ICD-10-CM | POA: Diagnosis not present

## 2018-04-27 DIAGNOSIS — E119 Type 2 diabetes mellitus without complications: Secondary | ICD-10-CM | POA: Diagnosis not present

## 2018-04-27 DIAGNOSIS — C642 Malignant neoplasm of left kidney, except renal pelvis: Secondary | ICD-10-CM | POA: Diagnosis not present

## 2018-05-17 DIAGNOSIS — Z23 Encounter for immunization: Secondary | ICD-10-CM | POA: Diagnosis not present

## 2018-05-17 DIAGNOSIS — Z6827 Body mass index (BMI) 27.0-27.9, adult: Secondary | ICD-10-CM | POA: Diagnosis not present

## 2018-05-17 DIAGNOSIS — S41111A Laceration without foreign body of right upper arm, initial encounter: Secondary | ICD-10-CM | POA: Diagnosis not present

## 2018-06-07 DIAGNOSIS — E039 Hypothyroidism, unspecified: Secondary | ICD-10-CM | POA: Diagnosis not present

## 2018-06-07 DIAGNOSIS — E785 Hyperlipidemia, unspecified: Secondary | ICD-10-CM | POA: Diagnosis not present

## 2018-06-07 DIAGNOSIS — E538 Deficiency of other specified B group vitamins: Secondary | ICD-10-CM | POA: Diagnosis not present

## 2018-06-07 DIAGNOSIS — E1142 Type 2 diabetes mellitus with diabetic polyneuropathy: Secondary | ICD-10-CM | POA: Diagnosis not present

## 2018-06-07 DIAGNOSIS — I10 Essential (primary) hypertension: Secondary | ICD-10-CM | POA: Diagnosis not present

## 2018-06-09 DIAGNOSIS — E782 Mixed hyperlipidemia: Secondary | ICD-10-CM | POA: Diagnosis not present

## 2018-06-09 DIAGNOSIS — Z8553 Personal history of malignant neoplasm of renal pelvis: Secondary | ICD-10-CM | POA: Diagnosis not present

## 2018-06-09 DIAGNOSIS — E1129 Type 2 diabetes mellitus with other diabetic kidney complication: Secondary | ICD-10-CM | POA: Diagnosis not present

## 2018-06-09 DIAGNOSIS — E039 Hypothyroidism, unspecified: Secondary | ICD-10-CM | POA: Diagnosis not present

## 2018-06-09 DIAGNOSIS — E2740 Unspecified adrenocortical insufficiency: Secondary | ICD-10-CM | POA: Diagnosis not present

## 2018-06-09 DIAGNOSIS — I10 Essential (primary) hypertension: Secondary | ICD-10-CM | POA: Diagnosis not present

## 2018-06-09 DIAGNOSIS — E538 Deficiency of other specified B group vitamins: Secondary | ICD-10-CM | POA: Diagnosis not present

## 2018-06-09 DIAGNOSIS — K219 Gastro-esophageal reflux disease without esophagitis: Secondary | ICD-10-CM | POA: Diagnosis not present

## 2018-06-20 DIAGNOSIS — Z8553 Personal history of malignant neoplasm of renal pelvis: Secondary | ICD-10-CM | POA: Diagnosis not present

## 2018-06-20 DIAGNOSIS — R600 Localized edema: Secondary | ICD-10-CM | POA: Diagnosis not present

## 2018-06-20 DIAGNOSIS — Z6826 Body mass index (BMI) 26.0-26.9, adult: Secondary | ICD-10-CM | POA: Diagnosis not present

## 2018-06-20 DIAGNOSIS — E1129 Type 2 diabetes mellitus with other diabetic kidney complication: Secondary | ICD-10-CM | POA: Diagnosis not present

## 2018-06-27 DIAGNOSIS — R600 Localized edema: Secondary | ICD-10-CM | POA: Diagnosis not present

## 2018-07-20 DIAGNOSIS — E042 Nontoxic multinodular goiter: Secondary | ICD-10-CM | POA: Diagnosis not present

## 2018-07-20 DIAGNOSIS — E273 Drug-induced adrenocortical insufficiency: Secondary | ICD-10-CM | POA: Diagnosis not present

## 2018-07-20 DIAGNOSIS — E1122 Type 2 diabetes mellitus with diabetic chronic kidney disease: Secondary | ICD-10-CM | POA: Diagnosis not present

## 2018-07-20 DIAGNOSIS — N183 Chronic kidney disease, stage 3 (moderate): Secondary | ICD-10-CM | POA: Diagnosis not present

## 2018-07-27 DIAGNOSIS — E896 Postprocedural adrenocortical (-medullary) hypofunction: Secondary | ICD-10-CM | POA: Diagnosis not present

## 2018-07-27 DIAGNOSIS — C7889 Secondary malignant neoplasm of other digestive organs: Secondary | ICD-10-CM | POA: Diagnosis not present

## 2018-07-27 DIAGNOSIS — Z79899 Other long term (current) drug therapy: Secondary | ICD-10-CM | POA: Diagnosis not present

## 2018-07-27 DIAGNOSIS — Z08 Encounter for follow-up examination after completed treatment for malignant neoplasm: Secondary | ICD-10-CM | POA: Diagnosis not present

## 2018-07-27 DIAGNOSIS — C649 Malignant neoplasm of unspecified kidney, except renal pelvis: Secondary | ICD-10-CM | POA: Diagnosis not present

## 2018-07-27 DIAGNOSIS — E890 Postprocedural hypothyroidism: Secondary | ICD-10-CM | POA: Diagnosis not present

## 2018-07-27 DIAGNOSIS — Z905 Acquired absence of kidney: Secondary | ICD-10-CM | POA: Diagnosis not present

## 2018-07-27 DIAGNOSIS — Z85528 Personal history of other malignant neoplasm of kidney: Secondary | ICD-10-CM | POA: Diagnosis not present

## 2018-07-27 DIAGNOSIS — C7989 Secondary malignant neoplasm of other specified sites: Secondary | ICD-10-CM | POA: Diagnosis not present

## 2018-07-27 DIAGNOSIS — I10 Essential (primary) hypertension: Secondary | ICD-10-CM | POA: Diagnosis not present

## 2018-07-27 DIAGNOSIS — E2740 Unspecified adrenocortical insufficiency: Secondary | ICD-10-CM | POA: Diagnosis not present

## 2018-07-27 DIAGNOSIS — Z9221 Personal history of antineoplastic chemotherapy: Secondary | ICD-10-CM | POA: Diagnosis not present

## 2018-07-27 DIAGNOSIS — E119 Type 2 diabetes mellitus without complications: Secondary | ICD-10-CM | POA: Diagnosis not present

## 2018-07-27 DIAGNOSIS — R911 Solitary pulmonary nodule: Secondary | ICD-10-CM | POA: Diagnosis not present

## 2018-07-27 DIAGNOSIS — C642 Malignant neoplasm of left kidney, except renal pelvis: Secondary | ICD-10-CM | POA: Diagnosis not present

## 2018-08-10 DIAGNOSIS — C7989 Secondary malignant neoplasm of other specified sites: Secondary | ICD-10-CM | POA: Diagnosis not present

## 2018-08-10 DIAGNOSIS — I10 Essential (primary) hypertension: Secondary | ICD-10-CM | POA: Diagnosis not present

## 2018-08-10 DIAGNOSIS — C7889 Secondary malignant neoplasm of other digestive organs: Secondary | ICD-10-CM | POA: Diagnosis not present

## 2018-08-10 DIAGNOSIS — Z79899 Other long term (current) drug therapy: Secondary | ICD-10-CM | POA: Diagnosis not present

## 2018-08-10 DIAGNOSIS — E118 Type 2 diabetes mellitus with unspecified complications: Secondary | ICD-10-CM | POA: Diagnosis not present

## 2018-08-10 DIAGNOSIS — E039 Hypothyroidism, unspecified: Secondary | ICD-10-CM | POA: Diagnosis not present

## 2018-08-10 DIAGNOSIS — C787 Secondary malignant neoplasm of liver and intrahepatic bile duct: Secondary | ICD-10-CM | POA: Diagnosis not present

## 2018-08-10 DIAGNOSIS — E273 Drug-induced adrenocortical insufficiency: Secondary | ICD-10-CM | POA: Diagnosis not present

## 2018-08-10 DIAGNOSIS — C649 Malignant neoplasm of unspecified kidney, except renal pelvis: Secondary | ICD-10-CM | POA: Diagnosis not present

## 2018-08-10 DIAGNOSIS — C7971 Secondary malignant neoplasm of right adrenal gland: Secondary | ICD-10-CM | POA: Diagnosis not present

## 2018-08-10 DIAGNOSIS — C641 Malignant neoplasm of right kidney, except renal pelvis: Secondary | ICD-10-CM | POA: Diagnosis not present

## 2018-08-15 DIAGNOSIS — S59912A Unspecified injury of left forearm, initial encounter: Secondary | ICD-10-CM | POA: Diagnosis not present

## 2018-08-15 DIAGNOSIS — S51812A Laceration without foreign body of left forearm, initial encounter: Secondary | ICD-10-CM | POA: Diagnosis not present

## 2018-08-15 DIAGNOSIS — S4992XA Unspecified injury of left shoulder and upper arm, initial encounter: Secondary | ICD-10-CM | POA: Diagnosis not present

## 2018-08-15 DIAGNOSIS — M79632 Pain in left forearm: Secondary | ICD-10-CM | POA: Diagnosis not present

## 2018-08-16 DIAGNOSIS — S41102A Unspecified open wound of left upper arm, initial encounter: Secondary | ICD-10-CM | POA: Diagnosis not present

## 2018-08-16 DIAGNOSIS — M19012 Primary osteoarthritis, left shoulder: Secondary | ICD-10-CM | POA: Diagnosis not present

## 2018-08-16 DIAGNOSIS — S40012A Contusion of left shoulder, initial encounter: Secondary | ICD-10-CM | POA: Diagnosis not present

## 2018-08-16 DIAGNOSIS — S40022A Contusion of left upper arm, initial encounter: Secondary | ICD-10-CM | POA: Diagnosis not present

## 2018-08-16 DIAGNOSIS — M79622 Pain in left upper arm: Secondary | ICD-10-CM | POA: Diagnosis not present

## 2018-08-18 DIAGNOSIS — S41102D Unspecified open wound of left upper arm, subsequent encounter: Secondary | ICD-10-CM | POA: Diagnosis not present

## 2018-08-18 DIAGNOSIS — S40012D Contusion of left shoulder, subsequent encounter: Secondary | ICD-10-CM | POA: Diagnosis not present

## 2018-08-18 DIAGNOSIS — M25512 Pain in left shoulder: Secondary | ICD-10-CM | POA: Diagnosis not present

## 2018-09-07 DIAGNOSIS — C641 Malignant neoplasm of right kidney, except renal pelvis: Secondary | ICD-10-CM | POA: Diagnosis not present

## 2018-09-07 DIAGNOSIS — E039 Hypothyroidism, unspecified: Secondary | ICD-10-CM | POA: Diagnosis not present

## 2018-09-07 DIAGNOSIS — E273 Drug-induced adrenocortical insufficiency: Secondary | ICD-10-CM | POA: Diagnosis not present

## 2018-09-07 DIAGNOSIS — C787 Secondary malignant neoplasm of liver and intrahepatic bile duct: Secondary | ICD-10-CM | POA: Diagnosis not present

## 2018-09-07 DIAGNOSIS — E118 Type 2 diabetes mellitus with unspecified complications: Secondary | ICD-10-CM | POA: Diagnosis not present

## 2018-09-07 DIAGNOSIS — Z79899 Other long term (current) drug therapy: Secondary | ICD-10-CM | POA: Diagnosis not present

## 2018-09-07 DIAGNOSIS — C7971 Secondary malignant neoplasm of right adrenal gland: Secondary | ICD-10-CM | POA: Diagnosis not present

## 2018-09-07 DIAGNOSIS — C7889 Secondary malignant neoplasm of other digestive organs: Secondary | ICD-10-CM | POA: Diagnosis not present

## 2018-09-07 DIAGNOSIS — I10 Essential (primary) hypertension: Secondary | ICD-10-CM | POA: Diagnosis not present

## 2018-09-22 DIAGNOSIS — I10 Essential (primary) hypertension: Secondary | ICD-10-CM | POA: Diagnosis not present

## 2018-09-22 DIAGNOSIS — Z6826 Body mass index (BMI) 26.0-26.9, adult: Secondary | ICD-10-CM | POA: Diagnosis not present

## 2018-09-22 DIAGNOSIS — Z139 Encounter for screening, unspecified: Secondary | ICD-10-CM | POA: Diagnosis not present

## 2018-09-22 DIAGNOSIS — E785 Hyperlipidemia, unspecified: Secondary | ICD-10-CM | POA: Diagnosis not present

## 2018-09-22 DIAGNOSIS — E039 Hypothyroidism, unspecified: Secondary | ICD-10-CM | POA: Diagnosis not present

## 2018-09-22 DIAGNOSIS — Z8553 Personal history of malignant neoplasm of renal pelvis: Secondary | ICD-10-CM | POA: Diagnosis not present

## 2018-09-22 DIAGNOSIS — Z1331 Encounter for screening for depression: Secondary | ICD-10-CM | POA: Diagnosis not present

## 2018-09-22 DIAGNOSIS — E538 Deficiency of other specified B group vitamins: Secondary | ICD-10-CM | POA: Diagnosis not present

## 2018-09-22 DIAGNOSIS — K219 Gastro-esophageal reflux disease without esophagitis: Secondary | ICD-10-CM | POA: Diagnosis not present

## 2018-09-22 DIAGNOSIS — Z9181 History of falling: Secondary | ICD-10-CM | POA: Diagnosis not present

## 2018-09-22 DIAGNOSIS — E1142 Type 2 diabetes mellitus with diabetic polyneuropathy: Secondary | ICD-10-CM | POA: Diagnosis not present

## 2018-09-29 DIAGNOSIS — J019 Acute sinusitis, unspecified: Secondary | ICD-10-CM | POA: Diagnosis not present

## 2018-09-29 DIAGNOSIS — R5383 Other fatigue: Secondary | ICD-10-CM | POA: Diagnosis not present

## 2018-09-29 DIAGNOSIS — E860 Dehydration: Secondary | ICD-10-CM | POA: Diagnosis not present

## 2018-10-04 DIAGNOSIS — R42 Dizziness and giddiness: Secondary | ICD-10-CM | POA: Diagnosis not present

## 2018-10-04 DIAGNOSIS — H532 Diplopia: Secondary | ICD-10-CM | POA: Diagnosis not present

## 2018-10-04 DIAGNOSIS — C7989 Secondary malignant neoplasm of other specified sites: Secondary | ICD-10-CM | POA: Diagnosis not present

## 2018-10-04 DIAGNOSIS — C649 Malignant neoplasm of unspecified kidney, except renal pelvis: Secondary | ICD-10-CM | POA: Diagnosis not present

## 2018-10-05 DIAGNOSIS — E039 Hypothyroidism, unspecified: Secondary | ICD-10-CM | POA: Diagnosis not present

## 2018-10-05 DIAGNOSIS — E273 Drug-induced adrenocortical insufficiency: Secondary | ICD-10-CM | POA: Diagnosis not present

## 2018-10-05 DIAGNOSIS — I10 Essential (primary) hypertension: Secondary | ICD-10-CM | POA: Diagnosis not present

## 2018-10-05 DIAGNOSIS — Z79899 Other long term (current) drug therapy: Secondary | ICD-10-CM | POA: Diagnosis not present

## 2018-10-05 DIAGNOSIS — E118 Type 2 diabetes mellitus with unspecified complications: Secondary | ICD-10-CM | POA: Diagnosis not present

## 2018-10-05 DIAGNOSIS — H538 Other visual disturbances: Secondary | ICD-10-CM | POA: Diagnosis not present

## 2018-10-05 DIAGNOSIS — C641 Malignant neoplasm of right kidney, except renal pelvis: Secondary | ICD-10-CM | POA: Diagnosis not present

## 2018-10-05 DIAGNOSIS — C7889 Secondary malignant neoplasm of other digestive organs: Secondary | ICD-10-CM | POA: Diagnosis not present

## 2018-10-05 DIAGNOSIS — C787 Secondary malignant neoplasm of liver and intrahepatic bile duct: Secondary | ICD-10-CM | POA: Diagnosis not present

## 2018-10-05 DIAGNOSIS — C7971 Secondary malignant neoplasm of right adrenal gland: Secondary | ICD-10-CM | POA: Diagnosis not present

## 2018-10-05 DIAGNOSIS — H532 Diplopia: Secondary | ICD-10-CM | POA: Diagnosis not present

## 2018-10-07 DIAGNOSIS — Z961 Presence of intraocular lens: Secondary | ICD-10-CM | POA: Diagnosis not present

## 2018-10-07 DIAGNOSIS — H5201 Hypermetropia, right eye: Secondary | ICD-10-CM | POA: Diagnosis not present

## 2018-10-07 DIAGNOSIS — E119 Type 2 diabetes mellitus without complications: Secondary | ICD-10-CM | POA: Diagnosis not present

## 2018-10-07 DIAGNOSIS — H5212 Myopia, left eye: Secondary | ICD-10-CM | POA: Diagnosis not present

## 2018-10-07 DIAGNOSIS — H532 Diplopia: Secondary | ICD-10-CM | POA: Diagnosis not present

## 2018-10-07 DIAGNOSIS — C6990 Malignant neoplasm of unspecified site of unspecified eye: Secondary | ICD-10-CM | POA: Diagnosis not present

## 2018-10-18 DIAGNOSIS — M542 Cervicalgia: Secondary | ICD-10-CM | POA: Diagnosis not present

## 2018-10-18 DIAGNOSIS — E782 Mixed hyperlipidemia: Secondary | ICD-10-CM | POA: Diagnosis not present

## 2018-10-18 DIAGNOSIS — C7902 Secondary malignant neoplasm of left kidney and renal pelvis: Secondary | ICD-10-CM | POA: Diagnosis not present

## 2018-10-18 DIAGNOSIS — C7971 Secondary malignant neoplasm of right adrenal gland: Secondary | ICD-10-CM | POA: Diagnosis not present

## 2018-10-18 DIAGNOSIS — C787 Secondary malignant neoplasm of liver and intrahepatic bile duct: Secondary | ICD-10-CM | POA: Diagnosis not present

## 2018-10-18 DIAGNOSIS — C7989 Secondary malignant neoplasm of other specified sites: Secondary | ICD-10-CM | POA: Diagnosis not present

## 2018-10-18 DIAGNOSIS — R1310 Dysphagia, unspecified: Secondary | ICD-10-CM | POA: Diagnosis not present

## 2018-10-18 DIAGNOSIS — C7901 Secondary malignant neoplasm of right kidney and renal pelvis: Secondary | ICD-10-CM | POA: Diagnosis not present

## 2018-10-18 DIAGNOSIS — Z139 Encounter for screening, unspecified: Secondary | ICD-10-CM | POA: Diagnosis not present

## 2018-10-18 DIAGNOSIS — E039 Hypothyroidism, unspecified: Secondary | ICD-10-CM | POA: Diagnosis not present

## 2018-10-18 DIAGNOSIS — I10 Essential (primary) hypertension: Secondary | ICD-10-CM | POA: Diagnosis not present

## 2018-10-18 DIAGNOSIS — E1165 Type 2 diabetes mellitus with hyperglycemia: Secondary | ICD-10-CM | POA: Diagnosis not present

## 2018-10-18 DIAGNOSIS — C649 Malignant neoplasm of unspecified kidney, except renal pelvis: Secondary | ICD-10-CM | POA: Diagnosis not present

## 2018-10-18 DIAGNOSIS — E119 Type 2 diabetes mellitus without complications: Secondary | ICD-10-CM | POA: Diagnosis not present

## 2018-10-18 DIAGNOSIS — E1122 Type 2 diabetes mellitus with diabetic chronic kidney disease: Secondary | ICD-10-CM | POA: Diagnosis not present

## 2018-10-18 DIAGNOSIS — R5383 Other fatigue: Secondary | ICD-10-CM | POA: Diagnosis not present

## 2018-10-18 DIAGNOSIS — E890 Postprocedural hypothyroidism: Secondary | ICD-10-CM | POA: Diagnosis not present

## 2018-10-18 DIAGNOSIS — R1314 Dysphagia, pharyngoesophageal phase: Secondary | ICD-10-CM | POA: Diagnosis not present

## 2018-10-18 DIAGNOSIS — C7889 Secondary malignant neoplasm of other digestive organs: Secondary | ICD-10-CM | POA: Diagnosis not present

## 2018-10-18 DIAGNOSIS — E2749 Other adrenocortical insufficiency: Secondary | ICD-10-CM | POA: Diagnosis not present

## 2018-10-18 DIAGNOSIS — E279 Disorder of adrenal gland, unspecified: Secondary | ICD-10-CM | POA: Diagnosis not present

## 2018-10-18 DIAGNOSIS — E118 Type 2 diabetes mellitus with unspecified complications: Secondary | ICD-10-CM | POA: Diagnosis not present

## 2018-10-18 DIAGNOSIS — N183 Chronic kidney disease, stage 3 (moderate): Secondary | ICD-10-CM | POA: Diagnosis not present

## 2018-10-18 DIAGNOSIS — Z7984 Long term (current) use of oral hypoglycemic drugs: Secondary | ICD-10-CM | POA: Diagnosis not present

## 2018-10-20 DIAGNOSIS — S139XXA Sprain of joints and ligaments of unspecified parts of neck, initial encounter: Secondary | ICD-10-CM | POA: Diagnosis not present

## 2018-10-20 DIAGNOSIS — M25519 Pain in unspecified shoulder: Secondary | ICD-10-CM | POA: Diagnosis not present

## 2018-10-20 DIAGNOSIS — M542 Cervicalgia: Secondary | ICD-10-CM | POA: Diagnosis not present

## 2018-10-26 DIAGNOSIS — R1314 Dysphagia, pharyngoesophageal phase: Secondary | ICD-10-CM | POA: Diagnosis not present

## 2018-10-26 DIAGNOSIS — E0789 Other specified disorders of thyroid: Secondary | ICD-10-CM | POA: Diagnosis not present

## 2018-10-27 DIAGNOSIS — M6281 Muscle weakness (generalized): Secondary | ICD-10-CM | POA: Diagnosis not present

## 2018-10-27 DIAGNOSIS — E041 Nontoxic single thyroid nodule: Secondary | ICD-10-CM | POA: Diagnosis not present

## 2018-10-27 DIAGNOSIS — K8681 Exocrine pancreatic insufficiency: Secondary | ICD-10-CM | POA: Diagnosis not present

## 2018-10-27 DIAGNOSIS — C641 Malignant neoplasm of right kidney, except renal pelvis: Secondary | ICD-10-CM | POA: Diagnosis not present

## 2018-10-27 DIAGNOSIS — Z79899 Other long term (current) drug therapy: Secondary | ICD-10-CM | POA: Diagnosis not present

## 2018-10-27 DIAGNOSIS — R5383 Other fatigue: Secondary | ICD-10-CM | POA: Diagnosis not present

## 2018-10-27 DIAGNOSIS — H532 Diplopia: Secondary | ICD-10-CM | POA: Diagnosis not present

## 2018-10-27 DIAGNOSIS — C7889 Secondary malignant neoplasm of other digestive organs: Secondary | ICD-10-CM | POA: Diagnosis not present

## 2018-10-27 DIAGNOSIS — E273 Drug-induced adrenocortical insufficiency: Secondary | ICD-10-CM | POA: Diagnosis not present

## 2018-10-27 DIAGNOSIS — C787 Secondary malignant neoplasm of liver and intrahepatic bile duct: Secondary | ICD-10-CM | POA: Diagnosis not present

## 2018-10-27 DIAGNOSIS — M2560 Stiffness of unspecified joint, not elsewhere classified: Secondary | ICD-10-CM | POA: Diagnosis not present

## 2018-10-27 DIAGNOSIS — C7971 Secondary malignant neoplasm of right adrenal gland: Secondary | ICD-10-CM | POA: Diagnosis not present

## 2018-10-27 DIAGNOSIS — C649 Malignant neoplasm of unspecified kidney, except renal pelvis: Secondary | ICD-10-CM | POA: Diagnosis not present

## 2018-10-27 DIAGNOSIS — R740 Nonspecific elevation of levels of transaminase and lactic acid dehydrogenase [LDH]: Secondary | ICD-10-CM | POA: Diagnosis not present

## 2018-10-28 DIAGNOSIS — C649 Malignant neoplasm of unspecified kidney, except renal pelvis: Secondary | ICD-10-CM | POA: Diagnosis not present

## 2018-10-28 DIAGNOSIS — H532 Diplopia: Secondary | ICD-10-CM | POA: Diagnosis not present

## 2018-10-29 DIAGNOSIS — C642 Malignant neoplasm of left kidney, except renal pelvis: Secondary | ICD-10-CM | POA: Diagnosis not present

## 2018-10-29 DIAGNOSIS — C649 Malignant neoplasm of unspecified kidney, except renal pelvis: Secondary | ICD-10-CM | POA: Diagnosis not present

## 2018-10-29 DIAGNOSIS — H532 Diplopia: Secondary | ICD-10-CM | POA: Diagnosis not present

## 2018-11-02 DIAGNOSIS — Z79899 Other long term (current) drug therapy: Secondary | ICD-10-CM | POA: Diagnosis not present

## 2018-11-02 DIAGNOSIS — C7970 Secondary malignant neoplasm of unspecified adrenal gland: Secondary | ICD-10-CM | POA: Diagnosis not present

## 2018-11-02 DIAGNOSIS — H538 Other visual disturbances: Secondary | ICD-10-CM | POA: Diagnosis not present

## 2018-11-02 DIAGNOSIS — E119 Type 2 diabetes mellitus without complications: Secondary | ICD-10-CM | POA: Diagnosis not present

## 2018-11-02 DIAGNOSIS — Z905 Acquired absence of kidney: Secondary | ICD-10-CM | POA: Diagnosis not present

## 2018-11-02 DIAGNOSIS — C7889 Secondary malignant neoplasm of other digestive organs: Secondary | ICD-10-CM | POA: Diagnosis not present

## 2018-11-02 DIAGNOSIS — C7971 Secondary malignant neoplasm of right adrenal gland: Secondary | ICD-10-CM | POA: Diagnosis not present

## 2018-11-02 DIAGNOSIS — R1310 Dysphagia, unspecified: Secondary | ICD-10-CM | POA: Diagnosis not present

## 2018-11-02 DIAGNOSIS — H532 Diplopia: Secondary | ICD-10-CM | POA: Diagnosis not present

## 2018-11-02 DIAGNOSIS — Z9889 Other specified postprocedural states: Secondary | ICD-10-CM | POA: Diagnosis not present

## 2018-11-02 DIAGNOSIS — E273 Drug-induced adrenocortical insufficiency: Secondary | ICD-10-CM | POA: Diagnosis not present

## 2018-11-02 DIAGNOSIS — C787 Secondary malignant neoplasm of liver and intrahepatic bile duct: Secondary | ICD-10-CM | POA: Diagnosis not present

## 2018-11-02 DIAGNOSIS — I10 Essential (primary) hypertension: Secondary | ICD-10-CM | POA: Diagnosis not present

## 2018-11-02 DIAGNOSIS — C7989 Secondary malignant neoplasm of other specified sites: Secondary | ICD-10-CM | POA: Diagnosis not present

## 2018-11-02 DIAGNOSIS — E032 Hypothyroidism due to medicaments and other exogenous substances: Secondary | ICD-10-CM | POA: Diagnosis not present

## 2018-11-02 DIAGNOSIS — C649 Malignant neoplasm of unspecified kidney, except renal pelvis: Secondary | ICD-10-CM | POA: Diagnosis not present

## 2018-11-02 DIAGNOSIS — E0789 Other specified disorders of thyroid: Secondary | ICD-10-CM | POA: Diagnosis not present

## 2018-11-02 DIAGNOSIS — C642 Malignant neoplasm of left kidney, except renal pelvis: Secondary | ICD-10-CM | POA: Diagnosis not present

## 2018-11-07 DIAGNOSIS — H538 Other visual disturbances: Secondary | ICD-10-CM | POA: Diagnosis not present

## 2018-11-07 DIAGNOSIS — E039 Hypothyroidism, unspecified: Secondary | ICD-10-CM | POA: Diagnosis not present

## 2018-11-07 DIAGNOSIS — E7800 Pure hypercholesterolemia, unspecified: Secondary | ICD-10-CM | POA: Diagnosis not present

## 2018-11-07 DIAGNOSIS — Z7984 Long term (current) use of oral hypoglycemic drugs: Secondary | ICD-10-CM | POA: Diagnosis not present

## 2018-11-07 DIAGNOSIS — Z8249 Family history of ischemic heart disease and other diseases of the circulatory system: Secondary | ICD-10-CM | POA: Diagnosis not present

## 2018-11-07 DIAGNOSIS — Z7952 Long term (current) use of systemic steroids: Secondary | ICD-10-CM | POA: Diagnosis not present

## 2018-11-07 DIAGNOSIS — K219 Gastro-esophageal reflux disease without esophagitis: Secondary | ICD-10-CM | POA: Diagnosis not present

## 2018-11-07 DIAGNOSIS — T451X5A Adverse effect of antineoplastic and immunosuppressive drugs, initial encounter: Secondary | ICD-10-CM | POA: Diagnosis not present

## 2018-11-07 DIAGNOSIS — R918 Other nonspecific abnormal finding of lung field: Secondary | ICD-10-CM | POA: Diagnosis not present

## 2018-11-07 DIAGNOSIS — Z833 Family history of diabetes mellitus: Secondary | ICD-10-CM | POA: Diagnosis not present

## 2018-11-07 DIAGNOSIS — C7889 Secondary malignant neoplasm of other digestive organs: Secondary | ICD-10-CM | POA: Diagnosis not present

## 2018-11-07 DIAGNOSIS — E1165 Type 2 diabetes mellitus with hyperglycemia: Secondary | ICD-10-CM | POA: Diagnosis not present

## 2018-11-07 DIAGNOSIS — Z85850 Personal history of malignant neoplasm of thyroid: Secondary | ICD-10-CM | POA: Diagnosis not present

## 2018-11-07 DIAGNOSIS — E273 Drug-induced adrenocortical insufficiency: Secondary | ICD-10-CM | POA: Diagnosis not present

## 2018-11-07 DIAGNOSIS — E785 Hyperlipidemia, unspecified: Secondary | ICD-10-CM | POA: Diagnosis not present

## 2018-11-07 DIAGNOSIS — Z85528 Personal history of other malignant neoplasm of kidney: Secondary | ICD-10-CM | POA: Diagnosis not present

## 2018-11-07 DIAGNOSIS — R4702 Dysphasia: Secondary | ICD-10-CM | POA: Diagnosis not present

## 2018-11-07 DIAGNOSIS — C642 Malignant neoplasm of left kidney, except renal pelvis: Secondary | ICD-10-CM | POA: Diagnosis not present

## 2018-11-07 DIAGNOSIS — H02409 Unspecified ptosis of unspecified eyelid: Secondary | ICD-10-CM | POA: Diagnosis not present

## 2018-11-07 DIAGNOSIS — Z6821 Body mass index (BMI) 21.0-21.9, adult: Secondary | ICD-10-CM | POA: Diagnosis not present

## 2018-11-07 DIAGNOSIS — I10 Essential (primary) hypertension: Secondary | ICD-10-CM | POA: Diagnosis not present

## 2018-11-07 DIAGNOSIS — Z9049 Acquired absence of other specified parts of digestive tract: Secondary | ICD-10-CM | POA: Diagnosis not present

## 2018-11-07 DIAGNOSIS — R531 Weakness: Secondary | ICD-10-CM | POA: Diagnosis not present

## 2018-11-07 DIAGNOSIS — C7989 Secondary malignant neoplasm of other specified sites: Secondary | ICD-10-CM | POA: Diagnosis not present

## 2018-11-07 DIAGNOSIS — Z7982 Long term (current) use of aspirin: Secondary | ICD-10-CM | POA: Diagnosis not present

## 2018-11-07 DIAGNOSIS — E890 Postprocedural hypothyroidism: Secondary | ICD-10-CM | POA: Diagnosis not present

## 2018-11-07 DIAGNOSIS — Z905 Acquired absence of kidney: Secondary | ICD-10-CM | POA: Diagnosis not present

## 2018-11-07 DIAGNOSIS — R471 Dysarthria and anarthria: Secondary | ICD-10-CM | POA: Diagnosis not present

## 2018-11-07 DIAGNOSIS — Z87891 Personal history of nicotine dependence: Secondary | ICD-10-CM | POA: Diagnosis not present

## 2018-11-07 DIAGNOSIS — C7971 Secondary malignant neoplasm of right adrenal gland: Secondary | ICD-10-CM | POA: Diagnosis not present

## 2018-11-07 DIAGNOSIS — R633 Feeding difficulties: Secondary | ICD-10-CM | POA: Diagnosis not present

## 2018-11-07 DIAGNOSIS — C787 Secondary malignant neoplasm of liver and intrahepatic bile duct: Secondary | ICD-10-CM | POA: Diagnosis not present

## 2018-11-07 DIAGNOSIS — G7000 Myasthenia gravis without (acute) exacerbation: Secondary | ICD-10-CM | POA: Diagnosis not present

## 2018-11-07 DIAGNOSIS — C3490 Malignant neoplasm of unspecified part of unspecified bronchus or lung: Secondary | ICD-10-CM | POA: Diagnosis not present

## 2018-11-07 DIAGNOSIS — C7970 Secondary malignant neoplasm of unspecified adrenal gland: Secondary | ICD-10-CM | POA: Diagnosis not present

## 2018-11-07 DIAGNOSIS — R1312 Dysphagia, oropharyngeal phase: Secondary | ICD-10-CM | POA: Diagnosis not present

## 2018-11-07 DIAGNOSIS — H532 Diplopia: Secondary | ICD-10-CM | POA: Diagnosis not present

## 2018-11-07 DIAGNOSIS — R1310 Dysphagia, unspecified: Secondary | ICD-10-CM | POA: Diagnosis not present

## 2018-11-07 DIAGNOSIS — Z79899 Other long term (current) drug therapy: Secondary | ICD-10-CM | POA: Diagnosis not present

## 2018-11-07 DIAGNOSIS — E43 Unspecified severe protein-calorie malnutrition: Secondary | ICD-10-CM | POA: Diagnosis not present

## 2018-11-16 DIAGNOSIS — Z23 Encounter for immunization: Secondary | ICD-10-CM | POA: Diagnosis not present

## 2018-11-18 DIAGNOSIS — C7989 Secondary malignant neoplasm of other specified sites: Secondary | ICD-10-CM | POA: Diagnosis not present

## 2018-11-18 DIAGNOSIS — Z87891 Personal history of nicotine dependence: Secondary | ICD-10-CM | POA: Diagnosis not present

## 2018-11-18 DIAGNOSIS — Z79899 Other long term (current) drug therapy: Secondary | ICD-10-CM | POA: Diagnosis not present

## 2018-11-18 DIAGNOSIS — C642 Malignant neoplasm of left kidney, except renal pelvis: Secondary | ICD-10-CM | POA: Diagnosis not present

## 2018-11-18 DIAGNOSIS — G7000 Myasthenia gravis without (acute) exacerbation: Secondary | ICD-10-CM | POA: Diagnosis not present

## 2018-11-18 DIAGNOSIS — Z8249 Family history of ischemic heart disease and other diseases of the circulatory system: Secondary | ICD-10-CM | POA: Diagnosis not present

## 2018-11-18 DIAGNOSIS — R531 Weakness: Secondary | ICD-10-CM | POA: Diagnosis not present

## 2018-11-23 DIAGNOSIS — G7000 Myasthenia gravis without (acute) exacerbation: Secondary | ICD-10-CM | POA: Diagnosis not present

## 2018-11-23 DIAGNOSIS — E1142 Type 2 diabetes mellitus with diabetic polyneuropathy: Secondary | ICD-10-CM | POA: Diagnosis not present

## 2018-11-23 DIAGNOSIS — E039 Hypothyroidism, unspecified: Secondary | ICD-10-CM | POA: Diagnosis not present

## 2018-11-23 DIAGNOSIS — C7989 Secondary malignant neoplasm of other specified sites: Secondary | ICD-10-CM | POA: Diagnosis not present

## 2018-11-23 DIAGNOSIS — I10 Essential (primary) hypertension: Secondary | ICD-10-CM | POA: Diagnosis not present

## 2018-11-23 DIAGNOSIS — C649 Malignant neoplasm of unspecified kidney, except renal pelvis: Secondary | ICD-10-CM | POA: Diagnosis not present

## 2018-11-28 DIAGNOSIS — E039 Hypothyroidism, unspecified: Secondary | ICD-10-CM | POA: Diagnosis not present

## 2018-11-30 DIAGNOSIS — E890 Postprocedural hypothyroidism: Secondary | ICD-10-CM | POA: Diagnosis not present

## 2018-11-30 DIAGNOSIS — G7001 Myasthenia gravis with (acute) exacerbation: Secondary | ICD-10-CM | POA: Diagnosis not present

## 2018-11-30 DIAGNOSIS — G7000 Myasthenia gravis without (acute) exacerbation: Secondary | ICD-10-CM | POA: Diagnosis not present

## 2018-11-30 DIAGNOSIS — R1310 Dysphagia, unspecified: Secondary | ICD-10-CM | POA: Diagnosis not present

## 2018-11-30 DIAGNOSIS — H538 Other visual disturbances: Secondary | ICD-10-CM | POA: Diagnosis not present

## 2018-11-30 DIAGNOSIS — Z85528 Personal history of other malignant neoplasm of kidney: Secondary | ICD-10-CM | POA: Diagnosis not present

## 2018-11-30 DIAGNOSIS — E273 Drug-induced adrenocortical insufficiency: Secondary | ICD-10-CM | POA: Diagnosis not present

## 2018-11-30 DIAGNOSIS — R531 Weakness: Secondary | ICD-10-CM | POA: Diagnosis not present

## 2018-11-30 DIAGNOSIS — I10 Essential (primary) hypertension: Secondary | ICD-10-CM | POA: Diagnosis not present

## 2018-11-30 DIAGNOSIS — E2740 Unspecified adrenocortical insufficiency: Secondary | ICD-10-CM | POA: Diagnosis not present

## 2018-11-30 DIAGNOSIS — K861 Other chronic pancreatitis: Secondary | ICD-10-CM | POA: Diagnosis not present

## 2018-11-30 DIAGNOSIS — Z794 Long term (current) use of insulin: Secondary | ICD-10-CM | POA: Diagnosis not present

## 2018-11-30 DIAGNOSIS — E119 Type 2 diabetes mellitus without complications: Secondary | ICD-10-CM | POA: Diagnosis not present

## 2018-11-30 DIAGNOSIS — E1140 Type 2 diabetes mellitus with diabetic neuropathy, unspecified: Secondary | ICD-10-CM | POA: Diagnosis not present

## 2018-11-30 DIAGNOSIS — T380X5A Adverse effect of glucocorticoids and synthetic analogues, initial encounter: Secondary | ICD-10-CM | POA: Diagnosis not present

## 2018-11-30 DIAGNOSIS — Z8507 Personal history of malignant neoplasm of pancreas: Secondary | ICD-10-CM | POA: Diagnosis not present

## 2018-11-30 DIAGNOSIS — Z7984 Long term (current) use of oral hypoglycemic drugs: Secondary | ICD-10-CM | POA: Diagnosis not present

## 2018-11-30 DIAGNOSIS — E039 Hypothyroidism, unspecified: Secondary | ICD-10-CM | POA: Diagnosis not present

## 2018-11-30 DIAGNOSIS — E11649 Type 2 diabetes mellitus with hypoglycemia without coma: Secondary | ICD-10-CM | POA: Diagnosis not present

## 2018-11-30 DIAGNOSIS — E1165 Type 2 diabetes mellitus with hyperglycemia: Secondary | ICD-10-CM | POA: Diagnosis not present

## 2018-11-30 DIAGNOSIS — H02409 Unspecified ptosis of unspecified eyelid: Secondary | ICD-10-CM | POA: Diagnosis not present

## 2018-11-30 DIAGNOSIS — Z85850 Personal history of malignant neoplasm of thyroid: Secondary | ICD-10-CM | POA: Diagnosis not present

## 2018-11-30 DIAGNOSIS — E785 Hyperlipidemia, unspecified: Secondary | ICD-10-CM | POA: Diagnosis not present

## 2018-11-30 DIAGNOSIS — Z905 Acquired absence of kidney: Secondary | ICD-10-CM | POA: Diagnosis not present

## 2018-12-12 DIAGNOSIS — J449 Chronic obstructive pulmonary disease, unspecified: Secondary | ICD-10-CM | POA: Diagnosis not present

## 2018-12-12 DIAGNOSIS — J9801 Acute bronchospasm: Secondary | ICD-10-CM | POA: Diagnosis not present

## 2018-12-12 DIAGNOSIS — J209 Acute bronchitis, unspecified: Secondary | ICD-10-CM | POA: Diagnosis not present

## 2018-12-13 DIAGNOSIS — E042 Nontoxic multinodular goiter: Secondary | ICD-10-CM | POA: Diagnosis not present

## 2018-12-13 DIAGNOSIS — R600 Localized edema: Secondary | ICD-10-CM | POA: Diagnosis not present

## 2018-12-13 DIAGNOSIS — R0602 Shortness of breath: Secondary | ICD-10-CM | POA: Diagnosis not present

## 2018-12-13 DIAGNOSIS — E273 Drug-induced adrenocortical insufficiency: Secondary | ICD-10-CM | POA: Diagnosis not present

## 2018-12-13 DIAGNOSIS — K861 Other chronic pancreatitis: Secondary | ICD-10-CM | POA: Diagnosis not present

## 2018-12-13 DIAGNOSIS — I824Y1 Acute embolism and thrombosis of unspecified deep veins of right proximal lower extremity: Secondary | ICD-10-CM | POA: Diagnosis not present

## 2018-12-13 DIAGNOSIS — R250 Abnormal head movements: Secondary | ICD-10-CM | POA: Diagnosis not present

## 2018-12-13 DIAGNOSIS — C7889 Secondary malignant neoplasm of other digestive organs: Secondary | ICD-10-CM | POA: Diagnosis not present

## 2018-12-13 DIAGNOSIS — E0500 Thyrotoxicosis with diffuse goiter without thyrotoxic crisis or storm: Secondary | ICD-10-CM | POA: Diagnosis not present

## 2018-12-13 DIAGNOSIS — I10 Essential (primary) hypertension: Secondary | ICD-10-CM | POA: Diagnosis not present

## 2018-12-13 DIAGNOSIS — T380X5D Adverse effect of glucocorticoids and synthetic analogues, subsequent encounter: Secondary | ICD-10-CM | POA: Diagnosis not present

## 2018-12-13 DIAGNOSIS — K8681 Exocrine pancreatic insufficiency: Secondary | ICD-10-CM | POA: Diagnosis not present

## 2018-12-13 DIAGNOSIS — H02403 Unspecified ptosis of bilateral eyelids: Secondary | ICD-10-CM | POA: Diagnosis not present

## 2018-12-13 DIAGNOSIS — R918 Other nonspecific abnormal finding of lung field: Secondary | ICD-10-CM | POA: Diagnosis not present

## 2018-12-13 DIAGNOSIS — C642 Malignant neoplasm of left kidney, except renal pelvis: Secondary | ICD-10-CM | POA: Diagnosis not present

## 2018-12-13 DIAGNOSIS — G7000 Myasthenia gravis without (acute) exacerbation: Secondary | ICD-10-CM | POA: Diagnosis not present

## 2018-12-13 DIAGNOSIS — E1165 Type 2 diabetes mellitus with hyperglycemia: Secondary | ICD-10-CM | POA: Diagnosis not present

## 2018-12-14 DIAGNOSIS — R609 Edema, unspecified: Secondary | ICD-10-CM | POA: Diagnosis not present

## 2018-12-14 DIAGNOSIS — Z1159 Encounter for screening for other viral diseases: Secondary | ICD-10-CM | POA: Diagnosis not present

## 2018-12-14 DIAGNOSIS — J189 Pneumonia, unspecified organism: Secondary | ICD-10-CM | POA: Diagnosis not present

## 2018-12-14 DIAGNOSIS — R0602 Shortness of breath: Secondary | ICD-10-CM | POA: Diagnosis not present

## 2018-12-14 DIAGNOSIS — E860 Dehydration: Secondary | ICD-10-CM | POA: Diagnosis not present

## 2018-12-14 DIAGNOSIS — Z03818 Encounter for observation for suspected exposure to other biological agents ruled out: Secondary | ICD-10-CM | POA: Diagnosis not present

## 2018-12-15 DIAGNOSIS — R0602 Shortness of breath: Secondary | ICD-10-CM | POA: Diagnosis not present

## 2018-12-15 DIAGNOSIS — J189 Pneumonia, unspecified organism: Secondary | ICD-10-CM | POA: Diagnosis not present

## 2018-12-15 DIAGNOSIS — R609 Edema, unspecified: Secondary | ICD-10-CM | POA: Diagnosis not present

## 2018-12-15 DIAGNOSIS — E860 Dehydration: Secondary | ICD-10-CM | POA: Diagnosis not present

## 2018-12-16 DIAGNOSIS — R609 Edema, unspecified: Secondary | ICD-10-CM | POA: Diagnosis not present

## 2018-12-16 DIAGNOSIS — J189 Pneumonia, unspecified organism: Secondary | ICD-10-CM | POA: Diagnosis not present

## 2018-12-16 DIAGNOSIS — R0602 Shortness of breath: Secondary | ICD-10-CM | POA: Diagnosis not present

## 2018-12-16 DIAGNOSIS — E860 Dehydration: Secondary | ICD-10-CM | POA: Diagnosis not present

## 2018-12-18 DIAGNOSIS — G7000 Myasthenia gravis without (acute) exacerbation: Secondary | ICD-10-CM | POA: Diagnosis not present

## 2018-12-18 DIAGNOSIS — I10 Essential (primary) hypertension: Secondary | ICD-10-CM | POA: Diagnosis not present

## 2018-12-18 DIAGNOSIS — H02403 Unspecified ptosis of bilateral eyelids: Secondary | ICD-10-CM | POA: Diagnosis not present

## 2018-12-18 DIAGNOSIS — C7889 Secondary malignant neoplasm of other digestive organs: Secondary | ICD-10-CM | POA: Diagnosis not present

## 2018-12-18 DIAGNOSIS — K8681 Exocrine pancreatic insufficiency: Secondary | ICD-10-CM | POA: Diagnosis not present

## 2018-12-18 DIAGNOSIS — R250 Abnormal head movements: Secondary | ICD-10-CM | POA: Diagnosis not present

## 2018-12-18 DIAGNOSIS — T380X5D Adverse effect of glucocorticoids and synthetic analogues, subsequent encounter: Secondary | ICD-10-CM | POA: Diagnosis not present

## 2018-12-18 DIAGNOSIS — E1165 Type 2 diabetes mellitus with hyperglycemia: Secondary | ICD-10-CM | POA: Diagnosis not present

## 2018-12-18 DIAGNOSIS — E273 Drug-induced adrenocortical insufficiency: Secondary | ICD-10-CM | POA: Diagnosis not present

## 2018-12-18 DIAGNOSIS — E042 Nontoxic multinodular goiter: Secondary | ICD-10-CM | POA: Diagnosis not present

## 2018-12-18 DIAGNOSIS — K861 Other chronic pancreatitis: Secondary | ICD-10-CM | POA: Diagnosis not present

## 2018-12-18 DIAGNOSIS — C642 Malignant neoplasm of left kidney, except renal pelvis: Secondary | ICD-10-CM | POA: Diagnosis not present

## 2018-12-18 DIAGNOSIS — E0500 Thyrotoxicosis with diffuse goiter without thyrotoxic crisis or storm: Secondary | ICD-10-CM | POA: Diagnosis not present

## 2018-12-19 DIAGNOSIS — J189 Pneumonia, unspecified organism: Secondary | ICD-10-CM | POA: Diagnosis not present

## 2018-12-19 DIAGNOSIS — R0602 Shortness of breath: Secondary | ICD-10-CM | POA: Diagnosis not present

## 2018-12-19 DIAGNOSIS — E860 Dehydration: Secondary | ICD-10-CM | POA: Diagnosis not present

## 2018-12-19 DIAGNOSIS — R609 Edema, unspecified: Secondary | ICD-10-CM | POA: Diagnosis not present

## 2018-12-20 DIAGNOSIS — J189 Pneumonia, unspecified organism: Secondary | ICD-10-CM | POA: Diagnosis not present

## 2018-12-20 DIAGNOSIS — T380X5A Adverse effect of glucocorticoids and synthetic analogues, initial encounter: Secondary | ICD-10-CM | POA: Diagnosis not present

## 2018-12-20 DIAGNOSIS — E860 Dehydration: Secondary | ICD-10-CM | POA: Diagnosis not present

## 2018-12-20 DIAGNOSIS — R609 Edema, unspecified: Secondary | ICD-10-CM | POA: Diagnosis not present

## 2018-12-20 DIAGNOSIS — E118 Type 2 diabetes mellitus with unspecified complications: Secondary | ICD-10-CM | POA: Diagnosis not present

## 2018-12-20 DIAGNOSIS — E1165 Type 2 diabetes mellitus with hyperglycemia: Secondary | ICD-10-CM | POA: Diagnosis not present

## 2018-12-20 DIAGNOSIS — R0602 Shortness of breath: Secondary | ICD-10-CM | POA: Diagnosis not present

## 2018-12-21 DIAGNOSIS — R918 Other nonspecific abnormal finding of lung field: Secondary | ICD-10-CM | POA: Diagnosis not present

## 2018-12-21 DIAGNOSIS — J984 Other disorders of lung: Secondary | ICD-10-CM | POA: Diagnosis not present

## 2018-12-21 DIAGNOSIS — J189 Pneumonia, unspecified organism: Secondary | ICD-10-CM | POA: Diagnosis not present

## 2018-12-21 DIAGNOSIS — J90 Pleural effusion, not elsewhere classified: Secondary | ICD-10-CM | POA: Diagnosis not present

## 2018-12-23 DIAGNOSIS — H532 Diplopia: Secondary | ICD-10-CM | POA: Diagnosis not present

## 2018-12-23 DIAGNOSIS — H02403 Unspecified ptosis of bilateral eyelids: Secondary | ICD-10-CM | POA: Diagnosis not present

## 2018-12-23 DIAGNOSIS — R531 Weakness: Secondary | ICD-10-CM | POA: Diagnosis not present

## 2018-12-23 DIAGNOSIS — G7000 Myasthenia gravis without (acute) exacerbation: Secondary | ICD-10-CM | POA: Diagnosis not present

## 2018-12-28 DIAGNOSIS — C7989 Secondary malignant neoplasm of other specified sites: Secondary | ICD-10-CM | POA: Diagnosis not present

## 2018-12-28 DIAGNOSIS — E039 Hypothyroidism, unspecified: Secondary | ICD-10-CM | POA: Diagnosis not present

## 2018-12-28 DIAGNOSIS — Z794 Long term (current) use of insulin: Secondary | ICD-10-CM | POA: Diagnosis not present

## 2018-12-28 DIAGNOSIS — C649 Malignant neoplasm of unspecified kidney, except renal pelvis: Secondary | ICD-10-CM | POA: Diagnosis not present

## 2018-12-28 DIAGNOSIS — I10 Essential (primary) hypertension: Secondary | ICD-10-CM | POA: Diagnosis not present

## 2018-12-28 DIAGNOSIS — Z79899 Other long term (current) drug therapy: Secondary | ICD-10-CM | POA: Diagnosis not present

## 2018-12-28 DIAGNOSIS — C7970 Secondary malignant neoplasm of unspecified adrenal gland: Secondary | ICD-10-CM | POA: Diagnosis not present

## 2018-12-28 DIAGNOSIS — E119 Type 2 diabetes mellitus without complications: Secondary | ICD-10-CM | POA: Diagnosis not present

## 2018-12-28 DIAGNOSIS — G7000 Myasthenia gravis without (acute) exacerbation: Secondary | ICD-10-CM | POA: Diagnosis not present

## 2019-01-05 DIAGNOSIS — F419 Anxiety disorder, unspecified: Secondary | ICD-10-CM | POA: Diagnosis not present

## 2019-01-05 DIAGNOSIS — Z79899 Other long term (current) drug therapy: Secondary | ICD-10-CM | POA: Diagnosis not present

## 2019-01-05 DIAGNOSIS — C641 Malignant neoplasm of right kidney, except renal pelvis: Secondary | ICD-10-CM | POA: Diagnosis not present

## 2019-01-05 DIAGNOSIS — G7001 Myasthenia gravis with (acute) exacerbation: Secondary | ICD-10-CM | POA: Diagnosis not present

## 2019-01-05 DIAGNOSIS — C787 Secondary malignant neoplasm of liver and intrahepatic bile duct: Secondary | ICD-10-CM | POA: Diagnosis not present

## 2019-01-05 DIAGNOSIS — C7889 Secondary malignant neoplasm of other digestive organs: Secondary | ICD-10-CM | POA: Diagnosis not present

## 2019-01-05 DIAGNOSIS — C7971 Secondary malignant neoplasm of right adrenal gland: Secondary | ICD-10-CM | POA: Diagnosis not present

## 2019-01-06 DIAGNOSIS — H538 Other visual disturbances: Secondary | ICD-10-CM | POA: Diagnosis not present

## 2019-01-06 DIAGNOSIS — C7889 Secondary malignant neoplasm of other digestive organs: Secondary | ICD-10-CM | POA: Diagnosis not present

## 2019-01-06 DIAGNOSIS — C7971 Secondary malignant neoplasm of right adrenal gland: Secondary | ICD-10-CM | POA: Diagnosis not present

## 2019-01-06 DIAGNOSIS — C649 Malignant neoplasm of unspecified kidney, except renal pelvis: Secondary | ICD-10-CM | POA: Diagnosis not present

## 2019-01-06 DIAGNOSIS — Z87891 Personal history of nicotine dependence: Secondary | ICD-10-CM | POA: Diagnosis not present

## 2019-01-06 DIAGNOSIS — R531 Weakness: Secondary | ICD-10-CM | POA: Diagnosis not present

## 2019-01-06 DIAGNOSIS — C787 Secondary malignant neoplasm of liver and intrahepatic bile duct: Secondary | ICD-10-CM | POA: Diagnosis not present

## 2019-01-06 DIAGNOSIS — Z79899 Other long term (current) drug therapy: Secondary | ICD-10-CM | POA: Diagnosis not present

## 2019-01-06 DIAGNOSIS — H02402 Unspecified ptosis of left eyelid: Secondary | ICD-10-CM | POA: Diagnosis not present

## 2019-01-06 DIAGNOSIS — G7001 Myasthenia gravis with (acute) exacerbation: Secondary | ICD-10-CM | POA: Diagnosis not present

## 2019-01-24 DIAGNOSIS — C7989 Secondary malignant neoplasm of other specified sites: Secondary | ICD-10-CM | POA: Diagnosis not present

## 2019-01-24 DIAGNOSIS — Z9089 Acquired absence of other organs: Secondary | ICD-10-CM | POA: Diagnosis not present

## 2019-01-24 DIAGNOSIS — C649 Malignant neoplasm of unspecified kidney, except renal pelvis: Secondary | ICD-10-CM | POA: Diagnosis not present

## 2019-01-24 DIAGNOSIS — Z905 Acquired absence of kidney: Secondary | ICD-10-CM | POA: Diagnosis not present

## 2019-01-24 DIAGNOSIS — C7971 Secondary malignant neoplasm of right adrenal gland: Secondary | ICD-10-CM | POA: Diagnosis not present

## 2019-01-24 DIAGNOSIS — Z794 Long term (current) use of insulin: Secondary | ICD-10-CM | POA: Diagnosis not present

## 2019-01-24 DIAGNOSIS — C73 Malignant neoplasm of thyroid gland: Secondary | ICD-10-CM | POA: Diagnosis not present

## 2019-01-24 DIAGNOSIS — E271 Primary adrenocortical insufficiency: Secondary | ICD-10-CM | POA: Diagnosis not present

## 2019-01-24 DIAGNOSIS — E890 Postprocedural hypothyroidism: Secondary | ICD-10-CM | POA: Diagnosis not present

## 2019-01-24 DIAGNOSIS — E1165 Type 2 diabetes mellitus with hyperglycemia: Secondary | ICD-10-CM | POA: Diagnosis not present

## 2019-01-24 DIAGNOSIS — E118 Type 2 diabetes mellitus with unspecified complications: Secondary | ICD-10-CM | POA: Diagnosis not present

## 2019-01-25 DIAGNOSIS — C649 Malignant neoplasm of unspecified kidney, except renal pelvis: Secondary | ICD-10-CM | POA: Diagnosis not present

## 2019-01-25 DIAGNOSIS — C787 Secondary malignant neoplasm of liver and intrahepatic bile duct: Secondary | ICD-10-CM | POA: Diagnosis not present

## 2019-01-25 DIAGNOSIS — E890 Postprocedural hypothyroidism: Secondary | ICD-10-CM | POA: Diagnosis not present

## 2019-01-25 DIAGNOSIS — C7970 Secondary malignant neoplasm of unspecified adrenal gland: Secondary | ICD-10-CM | POA: Diagnosis not present

## 2019-01-25 DIAGNOSIS — C642 Malignant neoplasm of left kidney, except renal pelvis: Secondary | ICD-10-CM | POA: Diagnosis not present

## 2019-01-25 DIAGNOSIS — E089 Diabetes mellitus due to underlying condition without complications: Secondary | ICD-10-CM | POA: Diagnosis not present

## 2019-01-25 DIAGNOSIS — E2749 Other adrenocortical insufficiency: Secondary | ICD-10-CM | POA: Diagnosis not present

## 2019-01-25 DIAGNOSIS — E039 Hypothyroidism, unspecified: Secondary | ICD-10-CM | POA: Diagnosis not present

## 2019-01-25 DIAGNOSIS — R918 Other nonspecific abnormal finding of lung field: Secondary | ICD-10-CM | POA: Diagnosis not present

## 2019-01-25 DIAGNOSIS — I10 Essential (primary) hypertension: Secondary | ICD-10-CM | POA: Diagnosis not present

## 2019-01-25 DIAGNOSIS — E896 Postprocedural adrenocortical (-medullary) hypofunction: Secondary | ICD-10-CM | POA: Diagnosis not present

## 2019-01-25 DIAGNOSIS — C7971 Secondary malignant neoplasm of right adrenal gland: Secondary | ICD-10-CM | POA: Diagnosis not present

## 2019-01-25 DIAGNOSIS — C7989 Secondary malignant neoplasm of other specified sites: Secondary | ICD-10-CM | POA: Diagnosis not present

## 2019-01-25 DIAGNOSIS — G709 Myoneural disorder, unspecified: Secondary | ICD-10-CM | POA: Diagnosis not present

## 2019-01-25 DIAGNOSIS — T451X5A Adverse effect of antineoplastic and immunosuppressive drugs, initial encounter: Secondary | ICD-10-CM | POA: Diagnosis not present

## 2019-01-25 DIAGNOSIS — C7889 Secondary malignant neoplasm of other digestive organs: Secondary | ICD-10-CM | POA: Diagnosis not present

## 2019-02-02 DIAGNOSIS — G7001 Myasthenia gravis with (acute) exacerbation: Secondary | ICD-10-CM | POA: Diagnosis not present

## 2019-02-02 DIAGNOSIS — Z79899 Other long term (current) drug therapy: Secondary | ICD-10-CM | POA: Diagnosis not present

## 2019-02-03 DIAGNOSIS — C641 Malignant neoplasm of right kidney, except renal pelvis: Secondary | ICD-10-CM | POA: Diagnosis not present

## 2019-02-03 DIAGNOSIS — R531 Weakness: Secondary | ICD-10-CM | POA: Diagnosis not present

## 2019-02-03 DIAGNOSIS — H02402 Unspecified ptosis of left eyelid: Secondary | ICD-10-CM | POA: Diagnosis not present

## 2019-02-03 DIAGNOSIS — C7889 Secondary malignant neoplasm of other digestive organs: Secondary | ICD-10-CM | POA: Diagnosis not present

## 2019-02-03 DIAGNOSIS — C787 Secondary malignant neoplasm of liver and intrahepatic bile duct: Secondary | ICD-10-CM | POA: Diagnosis not present

## 2019-02-03 DIAGNOSIS — C7971 Secondary malignant neoplasm of right adrenal gland: Secondary | ICD-10-CM | POA: Diagnosis not present

## 2019-02-03 DIAGNOSIS — Z79899 Other long term (current) drug therapy: Secondary | ICD-10-CM | POA: Diagnosis not present

## 2019-02-03 DIAGNOSIS — G7001 Myasthenia gravis with (acute) exacerbation: Secondary | ICD-10-CM | POA: Diagnosis not present

## 2019-02-06 DIAGNOSIS — F419 Anxiety disorder, unspecified: Secondary | ICD-10-CM | POA: Diagnosis not present

## 2019-02-13 DIAGNOSIS — H6501 Acute serous otitis media, right ear: Secondary | ICD-10-CM | POA: Diagnosis not present

## 2019-02-13 DIAGNOSIS — J019 Acute sinusitis, unspecified: Secondary | ICD-10-CM | POA: Diagnosis not present

## 2019-12-03 ENCOUNTER — Emergency Department (HOSPITAL_COMMUNITY): Payer: Medicare HMO

## 2019-12-03 ENCOUNTER — Encounter (HOSPITAL_COMMUNITY): Payer: Self-pay | Admitting: Emergency Medicine

## 2019-12-03 ENCOUNTER — Other Ambulatory Visit: Payer: Self-pay

## 2019-12-03 ENCOUNTER — Inpatient Hospital Stay (HOSPITAL_COMMUNITY)
Admission: EM | Admit: 2019-12-03 | Discharge: 2019-12-07 | DRG: 177 | Disposition: A | Discharge status: Home Health | Payer: Medicare HMO | Attending: Internal Medicine | Admitting: Internal Medicine

## 2019-12-03 DIAGNOSIS — J9601 Acute respiratory failure with hypoxia: Secondary | ICD-10-CM | POA: Diagnosis not present

## 2019-12-03 DIAGNOSIS — Z85828 Personal history of other malignant neoplasm of skin: Secondary | ICD-10-CM | POA: Diagnosis not present

## 2019-12-03 DIAGNOSIS — K219 Gastro-esophageal reflux disease without esophagitis: Secondary | ICD-10-CM | POA: Diagnosis present

## 2019-12-03 DIAGNOSIS — Z85528 Personal history of other malignant neoplasm of kidney: Secondary | ICD-10-CM | POA: Diagnosis not present

## 2019-12-03 DIAGNOSIS — I10 Essential (primary) hypertension: Secondary | ICD-10-CM | POA: Diagnosis present

## 2019-12-03 DIAGNOSIS — E785 Hyperlipidemia, unspecified: Secondary | ICD-10-CM | POA: Diagnosis present

## 2019-12-03 DIAGNOSIS — E875 Hyperkalemia: Secondary | ICD-10-CM | POA: Diagnosis not present

## 2019-12-03 DIAGNOSIS — J1282 Pneumonia due to coronavirus disease 2019: Secondary | ICD-10-CM | POA: Diagnosis present

## 2019-12-03 DIAGNOSIS — N1832 Chronic kidney disease, stage 3b: Secondary | ICD-10-CM | POA: Diagnosis present

## 2019-12-03 DIAGNOSIS — R0902 Hypoxemia: Secondary | ICD-10-CM | POA: Diagnosis present

## 2019-12-03 DIAGNOSIS — E1122 Type 2 diabetes mellitus with diabetic chronic kidney disease: Secondary | ICD-10-CM | POA: Diagnosis present

## 2019-12-03 DIAGNOSIS — Z905 Acquired absence of kidney: Secondary | ICD-10-CM | POA: Diagnosis not present

## 2019-12-03 DIAGNOSIS — Z8249 Family history of ischemic heart disease and other diseases of the circulatory system: Secondary | ICD-10-CM | POA: Diagnosis not present

## 2019-12-03 DIAGNOSIS — U071 COVID-19: Principal | ICD-10-CM | POA: Diagnosis present

## 2019-12-03 DIAGNOSIS — E890 Postprocedural hypothyroidism: Secondary | ICD-10-CM | POA: Diagnosis present

## 2019-12-03 DIAGNOSIS — Z7952 Long term (current) use of systemic steroids: Secondary | ICD-10-CM | POA: Diagnosis not present

## 2019-12-03 DIAGNOSIS — Z87891 Personal history of nicotine dependence: Secondary | ICD-10-CM

## 2019-12-03 DIAGNOSIS — J189 Pneumonia, unspecified organism: Secondary | ICD-10-CM | POA: Diagnosis present

## 2019-12-03 DIAGNOSIS — G7000 Myasthenia gravis without (acute) exacerbation: Secondary | ICD-10-CM | POA: Diagnosis present

## 2019-12-03 DIAGNOSIS — G629 Polyneuropathy, unspecified: Secondary | ICD-10-CM | POA: Diagnosis present

## 2019-12-03 DIAGNOSIS — E273 Drug-induced adrenocortical insufficiency: Secondary | ICD-10-CM | POA: Diagnosis present

## 2019-12-03 DIAGNOSIS — Z7982 Long term (current) use of aspirin: Secondary | ICD-10-CM | POA: Diagnosis not present

## 2019-12-03 DIAGNOSIS — Z7984 Long term (current) use of oral hypoglycemic drugs: Secondary | ICD-10-CM

## 2019-12-03 DIAGNOSIS — Z9049 Acquired absence of other specified parts of digestive tract: Secondary | ICD-10-CM | POA: Diagnosis not present

## 2019-12-03 DIAGNOSIS — Z79899 Other long term (current) drug therapy: Secondary | ICD-10-CM

## 2019-12-03 DIAGNOSIS — E119 Type 2 diabetes mellitus without complications: Secondary | ICD-10-CM

## 2019-12-03 DIAGNOSIS — N182 Chronic kidney disease, stage 2 (mild): Secondary | ICD-10-CM | POA: Diagnosis present

## 2019-12-03 DIAGNOSIS — I129 Hypertensive chronic kidney disease with stage 1 through stage 4 chronic kidney disease, or unspecified chronic kidney disease: Secondary | ICD-10-CM | POA: Diagnosis present

## 2019-12-03 DIAGNOSIS — E2740 Unspecified adrenocortical insufficiency: Secondary | ICD-10-CM | POA: Diagnosis present

## 2019-12-03 DIAGNOSIS — R7989 Other specified abnormal findings of blood chemistry: Secondary | ICD-10-CM | POA: Diagnosis not present

## 2019-12-03 DIAGNOSIS — C649 Malignant neoplasm of unspecified kidney, except renal pelvis: Secondary | ICD-10-CM | POA: Diagnosis not present

## 2019-12-03 HISTORY — DX: Myasthenia gravis without (acute) exacerbation: G70.00

## 2019-12-03 LAB — COMPREHENSIVE METABOLIC PANEL
ALT: 182 U/L — ABNORMAL HIGH (ref 0–44)
AST: 287 U/L — ABNORMAL HIGH (ref 15–41)
Albumin: 2.2 g/dL — ABNORMAL LOW (ref 3.5–5.0)
Alkaline Phosphatase: 74 U/L (ref 38–126)
Anion gap: 11 (ref 5–15)
BUN: 16 mg/dL (ref 8–23)
CO2: 23 mmol/L (ref 22–32)
Calcium: 6.9 mg/dL — ABNORMAL LOW (ref 8.9–10.3)
Chloride: 99 mmol/L (ref 98–111)
Creatinine, Ser: 1.35 mg/dL — ABNORMAL HIGH (ref 0.61–1.24)
GFR, Estimated: 52 mL/min — ABNORMAL LOW (ref >60)
Glucose, Bld: 161 mg/dL — ABNORMAL HIGH (ref 70–99)
Potassium: 4.3 mmol/L (ref 3.5–5.1)
Sodium: 133 mmol/L — ABNORMAL LOW (ref 135–145)
Total Bilirubin: 0.4 mg/dL (ref 0.3–1.2)
Total Protein: 5.7 g/dL — ABNORMAL LOW (ref 6.5–8.1)

## 2019-12-03 LAB — RESPIRATORY PANEL BY RT PCR (FLU A&B, COVID)
Influenza A by PCR: NEGATIVE
Influenza B by PCR: NEGATIVE
SARS Coronavirus 2 by RT PCR: POSITIVE — AB

## 2019-12-03 LAB — D-DIMER, QUANTITATIVE: D-Dimer, Quant: 1.53 ug/mL-FEU — ABNORMAL HIGH (ref 0.00–0.50)

## 2019-12-03 LAB — FERRITIN: Ferritin: 75 ng/mL (ref 24–336)

## 2019-12-03 LAB — FIBRINOGEN: Fibrinogen: 647 mg/dL — ABNORMAL HIGH (ref 210–475)

## 2019-12-03 LAB — CBC WITH DIFFERENTIAL/PLATELET
Abs Immature Granulocytes: 0.02 10*3/uL (ref 0.00–0.07)
Basophils Absolute: 0 10*3/uL (ref 0.0–0.1)
Basophils Relative: 0 %
Eosinophils Absolute: 0 10*3/uL (ref 0.0–0.5)
Eosinophils Relative: 1 %
HCT: 33.2 % — ABNORMAL LOW (ref 39.0–52.0)
Hemoglobin: 10 g/dL — ABNORMAL LOW (ref 13.0–17.0)
Immature Granulocytes: 0 %
Lymphocytes Relative: 27 %
Lymphs Abs: 1.3 10*3/uL (ref 0.7–4.0)
MCH: 27.2 pg (ref 26.0–34.0)
MCHC: 30.1 g/dL (ref 30.0–36.0)
MCV: 90.5 fL (ref 80.0–100.0)
Monocytes Absolute: 0.3 10*3/uL (ref 0.1–1.0)
Monocytes Relative: 6 %
Neutro Abs: 3.2 10*3/uL (ref 1.7–7.7)
Neutrophils Relative %: 66 %
Platelets: 160 10*3/uL (ref 150–400)
RBC: 3.67 MIL/uL — ABNORMAL LOW (ref 4.22–5.81)
RDW: 19.4 % — ABNORMAL HIGH (ref 11.5–15.5)
WBC: 4.9 10*3/uL (ref 4.0–10.5)
nRBC: 0 % (ref 0.0–0.2)

## 2019-12-03 LAB — GLUCOSE, CAPILLARY: Glucose-Capillary: 367 mg/dL — ABNORMAL HIGH (ref 70–99)

## 2019-12-03 LAB — C-REACTIVE PROTEIN: CRP: 12.9 mg/dL — ABNORMAL HIGH (ref <1.0)

## 2019-12-03 LAB — TRIGLYCERIDES: Triglycerides: 96 mg/dL (ref <150)

## 2019-12-03 LAB — LACTATE DEHYDROGENASE: LDH: 400 U/L — ABNORMAL HIGH (ref 98–192)

## 2019-12-03 LAB — LACTIC ACID, PLASMA: Lactic Acid, Venous: 1.3 mmol/L (ref 0.5–1.9)

## 2019-12-03 LAB — CBG MONITORING, ED: Glucose-Capillary: 150 mg/dL — ABNORMAL HIGH (ref 70–99)

## 2019-12-03 LAB — PROCALCITONIN: Procalcitonin: 1.04 ng/mL

## 2019-12-03 MED ORDER — SODIUM CHLORIDE 0.9 % IV SOLN
200.0000 mg | Freq: Once | INTRAVENOUS | Status: AC
  Administered 2019-12-03: 200 mg via INTRAVENOUS
  Filled 2019-12-03: qty 40

## 2019-12-03 MED ORDER — SODIUM CHLORIDE 0.9 % IV SOLN: 1.0000 g | INTRAVENOUS | Status: DC

## 2019-12-03 MED ORDER — ONDANSETRON HCL 4 MG PO TABS: 4.0000 mg | ORAL_TABLET | Freq: Four times a day (QID) | ORAL | Status: DC | PRN

## 2019-12-03 MED ORDER — SODIUM CHLORIDE 0.9 % IV SOLN: 500.0000 mg | Freq: Once | INTRAVENOUS | Status: DC

## 2019-12-03 MED ORDER — HYDROCORTISONE NA SUCCINATE PF 100 MG IJ SOLR
50.0000 mg | Freq: Four times a day (QID) | INTRAMUSCULAR | Status: DC
  Administered 2019-12-04: 50 mg via INTRAVENOUS
  Filled 2019-12-03: qty 2

## 2019-12-03 MED ORDER — ALPRAZOLAM 0.25 MG PO TABS
0.2500 mg | ORAL_TABLET | Freq: Two times a day (BID) | ORAL | Status: DC | PRN
  Administered 2019-12-04: 0.25 mg via ORAL
  Filled 2019-12-03: qty 1

## 2019-12-03 MED ORDER — ACETAMINOPHEN 325 MG PO TABS
650.0000 mg | ORAL_TABLET | Freq: Once | ORAL | Status: AC
  Administered 2019-12-03: 650 mg via ORAL
  Filled 2019-12-03: qty 2

## 2019-12-03 MED ORDER — METHYLPREDNISOLONE SODIUM SUCC 125 MG IJ SOLR
125.0000 mg | Freq: Once | INTRAMUSCULAR | Status: AC
  Administered 2019-12-03: 125 mg via INTRAVENOUS
  Filled 2019-12-03: qty 2

## 2019-12-03 MED ORDER — DEXAMETHASONE SODIUM PHOSPHATE 10 MG/ML IJ SOLN
10.0000 mg | Freq: Once | INTRAMUSCULAR | Status: AC
  Administered 2019-12-03: 10 mg via INTRAVENOUS
  Filled 2019-12-03: qty 1

## 2019-12-03 MED ORDER — INSULIN ASPART 100 UNIT/ML ~~LOC~~ SOLN
0.0000 [IU] | Freq: Every day | SUBCUTANEOUS | Status: DC
  Administered 2019-12-03: 5 [IU] via SUBCUTANEOUS
  Administered 2019-12-06: 2 [IU] via SUBCUTANEOUS

## 2019-12-03 MED ORDER — ENOXAPARIN SODIUM 40 MG/0.4ML ~~LOC~~ SOLN
40.0000 mg | SUBCUTANEOUS | Status: DC
  Administered 2019-12-03: 40 mg via SUBCUTANEOUS
  Filled 2019-12-03: qty 0.4

## 2019-12-03 MED ORDER — SODIUM CHLORIDE 0.9 % IV SOLN: 500.0000 mg | INTRAVENOUS | Status: DC

## 2019-12-03 MED ORDER — CALCIUM GLUCONATE-NACL 1-0.675 GM/50ML-% IV SOLN
1.0000 g | Freq: Once | INTRAVENOUS | Status: DC
  Filled 2019-12-03: qty 50

## 2019-12-03 MED ORDER — PANTOPRAZOLE SODIUM 40 MG PO TBEC
40.0000 mg | DELAYED_RELEASE_TABLET | Freq: Every day | ORAL | Status: DC
  Administered 2019-12-04 – 2019-12-07 (×4): 40 mg via ORAL
  Filled 2019-12-03 (×4): qty 1

## 2019-12-03 MED ORDER — LEVOTHYROXINE SODIUM 25 MCG PO TABS
125.0000 ug | ORAL_TABLET | Freq: Every day | ORAL | Status: DC
  Administered 2019-12-04 – 2019-12-07 (×4): 125 ug via ORAL
  Filled 2019-12-03 (×4): qty 1

## 2019-12-03 MED ORDER — HYDROCOD POLST-CPM POLST ER 10-8 MG/5ML PO SUER: 5.0000 mL | Freq: Two times a day (BID) | ORAL | Status: DC | PRN

## 2019-12-03 MED ORDER — SODIUM CHLORIDE 0.9 % IV SOLN
100.0000 mg | Freq: Every day | INTRAVENOUS | Status: AC
  Administered 2019-12-04 – 2019-12-07 (×4): 100 mg via INTRAVENOUS
  Filled 2019-12-03 (×5): qty 20

## 2019-12-03 MED ORDER — ONDANSETRON HCL 4 MG/2ML IJ SOLN: 4.0000 mg | Freq: Four times a day (QID) | INTRAMUSCULAR | Status: DC | PRN

## 2019-12-03 MED ORDER — SODIUM CHLORIDE 0.9 % IV SOLN
1.0000 g | Freq: Once | INTRAVENOUS | Status: AC
  Administered 2019-12-03: 1 g via INTRAVENOUS
  Filled 2019-12-03: qty 10

## 2019-12-03 MED ORDER — GUAIFENESIN-DM 100-10 MG/5ML PO SYRP
10.0000 mL | ORAL_SOLUTION | ORAL | Status: DC | PRN
  Administered 2019-12-06 – 2019-12-07 (×2): 10 mL via ORAL
  Filled 2019-12-03 (×2): qty 10

## 2019-12-03 MED ORDER — LACTATED RINGERS IV BOLUS
500.0000 mL | Freq: Once | INTRAVENOUS | Status: AC
  Administered 2019-12-03: 500 mL via INTRAVENOUS

## 2019-12-03 MED ORDER — INSULIN ASPART 100 UNIT/ML ~~LOC~~ SOLN
0.0000 [IU] | Freq: Three times a day (TID) | SUBCUTANEOUS | Status: DC
  Administered 2019-12-04: 8 [IU] via SUBCUTANEOUS
  Administered 2019-12-04: 5 [IU] via SUBCUTANEOUS
  Administered 2019-12-04: 3 [IU] via SUBCUTANEOUS
  Administered 2019-12-05: 5 [IU] via SUBCUTANEOUS
  Administered 2019-12-05: 2 [IU] via SUBCUTANEOUS
  Administered 2019-12-06: 8 [IU] via SUBCUTANEOUS
  Administered 2019-12-07: 3 [IU] via SUBCUTANEOUS

## 2019-12-03 NOTE — ED Provider Notes (Addendum)
[facility] EMERGENCY DEPARTMENT Provider Note   CSN: [id number] Arrival date & time: 12/03/19  1325     History Chief Complaint  Patient presents with   Covid Positive    Kevin Hawkins is a 74 y.o. male.  HPI  Patient is 74 year old male with past medical history of hyperlipidemia, hypertension, renal cancer s/p radical nephrectomy, DM 2, Immunotherapy induced myasthenia gravis.  Has received IVIG and steroids in the past.  Patient is presented today with cough, fever, chills, patient states that he started having symptoms last Friday (8th) and tested positive for Covid on Thursday (14th).  He states he is not able to access his positive test.  He states that he has been feeling progressively more short of breath with exertion.  Denies any chest pain he states he feels overall very weak and has myalgias.  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    Per EMS patient was found to be 82% with good SPO2 waveform.  He was placed on 2 L nasal cannula brought to emergency department.  He also notices nausea but no vomiting and no diarrhea.  Per patient his shortness breath became significantly worse last night.  There was no sudden changes however in his ability to breathe.  He denies any pleuritic chest pain or exertional chest pain.     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 ([place]) 12/27/2014   Chronic kidney disease, stage 2, mildly decreased GFR 07/21/2014   Chest pain 07/20/2014   GERD (gastroesophageal reflux disease)    Hyperlipidemia, mixed     Type 2 diabetes mellitus without complication, without long-term current use of insulin (HCC)    Pain in the chest    Essential hypertension     Past Surgical History:  Procedure Laterality Date   APPENDECTOMY     CHOLECYSTECTOMY     NEPHRECTOMY Left ~ 2010   PANCREAS SURGERY     "I've got 2/3 of it left"   SKIN CANCER EXCISION Right    "under eye"       Family History  Problem Relation Age of Onset   Cancer Father        Possible bone cancer   Hypertension Mother    Heart attack Mother    Diabetes Sister    Prostate cancer Brother     Social History   Tobacco Use   Smoking status: Former Smoker    Packs/day: 2.00    Years: 20.00    Pack years: 40.00    Types: Cigarettes   Smokeless tobacco: Never Used   Tobacco comment: "stopped smoking cigarettes in the early 1970's"  Substance Use Topics   Alcohol use: Yes    Comment: "stopped drinking in the early 1970's"   Drug use: No    Home Medications Prior to Admission medications   Medication Sig Start Date End Date Taking? Authorizing Provider  amLODipine (NORVASC) 10 MG tablet Take 10 mg by mouth daily.    [provider]  aspirin 325  MG tablet Take 325 mg by mouth daily.    [provider]  atorvastatin (LIPITOR) 40 MG tablet Take 1 tablet (40 mg total) by mouth daily. 11/04/15   Doreatha Lew, MD  dexamethasone (DECADRON) 1 MG tablet Take 1 mg by mouth daily. 10/04/15   [provider]  fenofibrate 160 MG tablet Take 1 tablet (160 mg total) by mouth daily. 11/04/15   Doreatha Lew, MD  fludrocortisone (FLORINEF) 0.1 MG tablet Take 0.05 mg by mouth daily. 10/18/15   [provider]  glipiZIDE (GLUCOTROL) 10 MG tablet Take 10 mg by mouth daily. 10/04/15   [provider]  losartan (COZAAR) 100 MG tablet Take 100 mg by mouth daily.    [provider]  magnesium oxide (MAG-OX) 400 (241.3 Mg) MG tablet Take 800 mg by mouth daily.     [provider]  metFORMIN (GLUCOPHAGE-XR) 500 MG 24 hr tablet Take 500-1,000 mg by mouth 2 (two) times daily. Take 1000 mg in the morning and 500 in the evening at bedtime    [provider]  nitroGLYCERIN (NITROSTAT) 0.4 MG SL tablet Place 1 tablet (0.4 mg total) under the tongue every 5 (five) minutes as needed for chest pain. 07/21/14   Caren Griffins, MD  omeprazole (PRILOSEC) 40 MG capsule Take 40 mg by mouth daily.    [provider]  pazopanib (VOTRIENT) 200 MG tablet Take 600 mg by mouth daily. 10/09/15   [provider]  potassium chloride SA (K-DUR,KLOR-CON) 20 MEQ tablet Take 20 mEq by mouth daily.    [provider]  vitamin B-12 (CYANOCOBALAMIN) 100 MCG tablet Take 50 mcg by mouth daily.    [provider]    Allergies    Patient has no known allergies.  Review of Systems   Review of Systems  Constitutional: Positive for appetite change, chills, fatigue and fever.  HENT: Positive for sinus pressure. Negative for congestion.   Eyes: Negative for pain.  Respiratory: Positive for cough and shortness of breath.   Cardiovascular: Negative for chest pain and leg swelling.  Gastrointestinal: Positive for nausea. Negative for abdominal pain, diarrhea and vomiting.  Genitourinary: Negative for dysuria.  Musculoskeletal: Negative for myalgias.  Skin: Negative for rash.  Neurological: Negative for dizziness and headaches.    Physical Exam Updated Vital Signs BP (!) 119/57 (BP Location: Left Arm)    Pulse (!) 102    Temp 100.1 F (37.8 C) (Oral)    Resp (!) 24    Ht 5\' 6"  (1.676 m)    Wt 79.4 kg    SpO2 99%    BMI 28.25 kg/m   Physical Exam Vitals and nursing note reviewed.  Constitutional:      General: He is not in acute distress.    Appearance: He is ill-appearing (Somewhat chronically ill-appearing and weak appearing.).  HENT:     Head: Normocephalic and atraumatic.     Nose: Nose normal.     Mouth/Throat:     Mouth:  Mucous membranes are moist.  Eyes:     General: No scleral icterus. Cardiovascular:     Rate and Rhythm: Regular rhythm. Tachycardia present.     Pulses: Normal pulses.     Heart sounds: Normal heart sounds.     Comments: 104 Pulmonary:     Effort: Pulmonary effort is normal. No respiratory distress.     Breath sounds: No wheezing.     Comments: Rales auscultated diffusely worse in left lower base.  Patient is mildly tachypneic with a rate of 24-28.  SPO2 dropped to 87% when oxygen turned off.  He was placed back on 3 L. Abdominal:     Palpations: Abdomen is soft.     Tenderness: There is no abdominal tenderness.  Musculoskeletal:     Cervical back: Normal range of motion.     Right lower leg: No edema.     Left lower leg: No edema.     Comments: No focal tenderness to palpation of the extremities or calf.  No lower extremity edema.  Skin:    General: Skin is warm and dry.     Capillary Refill: Capillary refill takes less than 2 seconds.     Comments: Bruising in the right [facility]  Neurological:     Mental Status: He is alert. Mental status is at baseline.  Psychiatric:        Mood and Affect: Mood normal.        Behavior: Behavior normal.     ED Results / Procedures / Treatments   Labs (all labs ordered are listed, but only abnormal results are displayed) Labs Reviewed  CBC WITH DIFFERENTIAL/PLATELET - Abnormal; Notable for the following components:      Result Value   RBC 3.67 (*)    Hemoglobin 10.0 (*)    HCT 33.2 (*)    RDW 19.4 (*)    All other components within normal limits  CBG MONITORING, ED - Abnormal; Notable for the following components:   Glucose-Capillary 150 (*)    All other components within normal limits  RESPIRATORY PANEL BY RT PCR (FLU A&B, COVID)  CULTURE, BLOOD (ROUTINE X 2)  CULTURE, BLOOD (ROUTINE X 2)  LACTIC ACID, PLASMA  LACTIC ACID, PLASMA  COMPREHENSIVE METABOLIC PANEL  D-DIMER, QUANTITATIVE (NOT AT [facility])  PROCALCITONIN  LACTATE  DEHYDROGENASE  FERRITIN  TRIGLYCERIDES  FIBRINOGEN  C-REACTIVE PROTEIN    EKG None  Radiology DG Chest Port 1 View  Result Date: 12/03/2019 CLINICAL DATA:  Hypoxia.  Reported COVID-19 positive EXAM: PORTABLE CHEST 1 VIEW COMPARISON:  December 21, 2018 FINDINGS: There is scarring in the left base with questionable small left effusion. There is also mild scarring in the right base. There is ill-defined airspace opacity in the left lower lung region. Heart is upper normal in size with pulmonary vascularity normal. No adenopathy. No bone lesions. IMPRESSION: Ill-defined airspace opacity left lower lobe consistent with pneumonia. Suspect atypical organism pneumonia given this appearance. Scarring in the lung bases with questionable scarring versus small pleural effusion lateral left base. Stable cardiac silhouette. Electronically Signed   By: Lowella Grip III M.D.   On: 12/03/2019 15:20    Procedures .Critical Care Performed by: Tedd Sias, PA Authorized by: Tedd Sias, PA   Critical care provider statement:    Critical care time (minutes):  45   Critical care time was exclusive of:  Separately billable procedures and treating other patients and teaching time   Critical care was necessary to treat or prevent imminent or life-threatening deterioration of the following conditions:  Respiratory failure   Critical care was time spent personally by me on the following activities:  Discussions with consultants, evaluation of patient's response to treatment, examination of patient, review of old charts, re-evaluation of patient's condition, pulse oximetry, ordering and review of radiographic studies, ordering and review of laboratory studies and ordering and performing treatments and interventions   I assumed direction of critical care for this patient from another provider in  my specialty: no     (including critical care time)  Medications Ordered in ED Medications  remdesivir  200 mg in sodium chloride 0.9% 250 mL IVPB (200 mg Intravenous New Bag/Given 12/03/19 1556)    Followed by  remdesivir 100 mg in sodium chloride 0.9 % 100 mL IVPB (has no administration in time range)  dexamethasone (DECADRON) injection 10 mg (10 mg Intravenous Given 12/03/19 1441)  acetaminophen (TYLENOL) tablet 650 mg (650 mg Oral Given 12/03/19 1550)    ED Course  I have reviewed the triage vital signs and the nursing notes.  Pertinent labs & imaging results that were available during my care of the patient were reviewed by me and considered in my medical decision making (see chart for details).    MDM Rules/Calculators/A&P                          Patient is hypoxic 74 year old gentleman with multiple risk factors see HPI for full details presented a somewhat tachycardic tachypneic and hypoxic with very mildly elevated temperature p.o. likely true fever if checked rectally.  Does have history of myasthenia gravis secondary to anesthesia in the past.  Patient does have a history of renal radical nephrectomy.  Physical exam is notable for diffuse crackles worse in the left base.  Otherwise he has bruising in right AC.  Good pulses in all 4 extremities.  Is able to speak in full sentences.  Overall appears unwell but not acutely ill or toxic.  Per EMS patient hypoxic when seen in the field.  When I turned patient's oxygen off he desaturated to 87% after approximately 5 minutes.  This was without any exertion.  Patient care handed off to Dr. Vallery Ridge at the end of my shift.  Patient's laboratory work is still pending at this time.  Chest x-ray reviewed by myself does show evidence of atypical pneumonia likely Covid pneumonia.  Patient given Tylenol remdesivir and Decadron.  He has had a positive Covid test as an outpatient however is unable to provide me with results of this test and I am unable to find it on care everywhere or in the EMR otherwise.  EKG with no significant changes from prior.   No evidence of acute ischemia.  Covid labs obtained and pending at this time.  Final Clinical Impression(s) / ED Diagnoses Final diagnoses:  COVID-19  Hypoxia    Rx / DC Orders ED Discharge Orders    None       Tedd Sias, [place] 12/03/19 1614    Pati Gallo [place], [place] 12/03/19 1642    Charlesetta Shanks, MD 12/03/19 1946

## 2019-12-03 NOTE — H&P (Signed)
History and Physical    Kevin Hawkins:[id number] DOB: May 14, 1945 DOA: 12/03/2019  PCP: Leonides Sake, MD  Patient coming from: Home  I have personally briefly reviewed patient's old medical records in [place]  Chief Complaint: COVID  HPI: Kevin Hawkins is a 74 y.o. male with medical history significant of HTN, RCC s/p nephrectomy, reoccurrence in 2017 now on suppressive immunotherapy, myasthenia gravis from immunotherapy, chronic steroid use, DM2.  Pt unfortunately did not get the COVID vaccine.  Pt presents to the ED with cough, fever, chills, SOB.  Symptoms onset 10/8.  Tested positive for COVID 10/14.  Symptoms progressively worsened with worsening SOB, worse with exertion.  Non-productive cough.  No CP.  No vomiting nor diarrhea.  Has nausea.   ED Course: COVID positive, WBC 4.9k, creat 1.35 (around baseline), AST 287, ALT 182.  Procalcitonin 1.04.  CXR shows RLL PNA, atypical appearance.  EDP gave solumedrol, remdesivir, also covered for CAP with rocephin.  Satting 87% on RA, improved on 2L.   Review of Systems: As per HPI, otherwise all review of systems negative.  Past Medical History:  Diagnosis Date  . ED (erectile dysfunction)   . GERD (gastroesophageal reflux disease)   . Hyperlipidemia, mixed   . Hypertension   . Myasthenia gravis ([place])   . Renal cancer ([place])    S/P left nephrectomy 2010, reocurred 2017, on maint chemo  . Skin cancer    under right eye"  . Type II diabetes mellitus ([place])     Past Surgical History:  Procedure Laterality Date  . APPENDECTOMY    . CHOLECYSTECTOMY    . NEPHRECTOMY Left ~ 2010  . PANCREAS SURGERY     "I've got 2/3 of it left"  . SKIN CANCER EXCISION Right    "under eye"  . THYROIDECTOMY       reports that he has quit smoking. His smoking use included cigarettes. He has a 40.00 pack-year smoking history. He has never used smokeless tobacco. He reports current alcohol use. He reports that he does  not use drugs.  No Known Allergies  Family History  Problem Relation Age of Onset  . Cancer Father        Possible bone cancer  . Hypertension Mother   . Heart attack Mother   . Diabetes Sister   . Prostate cancer Brother      Prior to Admission medications   Medication Sig Start Date End Date Taking? Authorizing Provider  ALPRAZolam (XANAX) 0.25 MG tablet Take 0.25 mg by mouth 2 (two) times daily as needed. 10/26/19   [provider]  amLODipine (NORVASC) 10 MG tablet Take 10 mg by mouth daily.    [provider]  aspirin 325 MG tablet Take 325 mg by mouth daily.    [provider]  atorvastatin (LIPITOR) 40 MG tablet Take 1 tablet (40 mg total) by mouth daily. 11/04/15   Doreatha Lew, MD  benzonatate (TESSALON) 100 MG capsule Take by mouth. 12/03/19   [provider]  CABOMETYX 20 MG tablet Take 20 mg by mouth at bedtime. 11/17/19   [provider]  dexamethasone (DECADRON) 1 MG tablet Take 1 mg by mouth daily. 10/04/15   [provider]  fenofibrate 160 MG tablet Take 1 tablet (160 mg total) by mouth daily. 11/04/15   Doreatha Lew, MD  fludrocortisone (FLORINEF) 0.1 MG tablet Take 0.05 mg by mouth daily. 10/18/15   [provider]  furosemide (LASIX) 20  MG tablet Take 20 mg by mouth daily. 08/15/19   [provider]  glipiZIDE (GLUCOTROL) 10 MG tablet Take 10 mg by mouth daily. 10/04/15   [provider]  levothyroxine (SYNTHROID) 125 MCG tablet Take 125 mcg by mouth daily. 09/07/19   [provider]  losartan (COZAAR) 100 MG tablet Take 100 mg by mouth daily.    [provider]  magnesium oxide (MAG-OX) 400 (241.3 Mg) MG tablet Take 800 mg by mouth daily.    [provider]  metFORMIN (GLUCOPHAGE-XR) 500 MG 24 hr tablet Take 500-1,000 mg by mouth 2 (two) times daily. Take 1000 mg in the morning and 500 in the evening at bedtime    [provider]  nitroGLYCERIN  (NITROSTAT) 0.4 MG SL tablet Place 1 tablet (0.4 mg total) under the tongue every 5 (five) minutes as needed for chest pain. 07/21/14   Caren Griffins, MD  NOVOLIN R 100 UNIT/ML injection Inject 0-30 Units into the skin 3 (three) times daily as needed for high blood sugar.  11/21/19   [provider]  pantoprazole (PROTONIX) 40 MG tablet Take 40 mg by mouth daily. 10/03/19   [provider]  potassium chloride SA (K-DUR,KLOR-CON) 20 MEQ tablet Take 20 mEq by mouth daily.    [provider]  predniSONE (DELTASONE) 20 MG tablet Take 20 mg by mouth daily. 11/25/19   [provider]  vitamin B-12 (CYANOCOBALAMIN) 100 MCG tablet Take 50 mcg by mouth daily.    [provider]    Physical Exam: Vitals:   12/03/19 1703 12/03/19 1708 12/03/19 1900 12/03/19 2017  BP:  (!) 99/56 (!) 100/55   Pulse:  95 77   Resp:  (!) 25 18   Temp: 99.4 F (37.4 C)   97.8 F (36.6 C)  TempSrc: Oral   Oral  SpO2:  100% 98%   Weight:      Height:        Constitutional: NAD, calm, comfortable Eyes: PERRL, lids and conjunctivae normal ENMT: Mucous membranes are moist. Posterior pharynx clear of any exudate or lesions.Normal dentition.  Neck: normal, supple, no masses, no thyromegaly Respiratory: clear to auscultation bilaterally, no wheezing, no crackles. Normal respiratory effort. No accessory muscle use.  Cardiovascular: Regular rate and rhythm, no murmurs / rubs / gallops. No extremity edema. 2+ pedal pulses. No carotid bruits.  Abdomen: no tenderness, no masses palpated. No hepatosplenomegaly. Bowel sounds positive.  Musculoskeletal: no clubbing / cyanosis. No joint deformity upper and lower extremities. Good ROM, no contractures. Normal muscle tone.  Skin: no rashes, lesions, ulcers. No induration Neurologic: CN 2-12 grossly intact. Sensation intact, DTR normal. Strength 5/5 in all 4.  Psychiatric: Normal judgment and insight. Alert and oriented x 3. Normal mood.     Labs on Admission: I have personally reviewed following labs and imaging studies  CBC: Recent Labs  Lab 12/03/19 1541  WBC 4.9  NEUTROABS 3.2  HGB 10.0*  HCT 33.2*  MCV 90.5  PLT 017   Basic Metabolic Panel: Recent Labs  Lab 12/03/19 1541  NA 133*  K 4.3  CL 99  CO2 23  GLUCOSE 161*  BUN 16  CREATININE 1.35*  CALCIUM 6.9*   GFR: Estimated Creatinine Clearance: 49 mL/min (A) (by C-G formula based on SCr of 1.35 mg/dL (H)). Liver Function Tests: Recent Labs  Lab 12/03/19 1541  AST 287*  ALT 182*  ALKPHOS 74  BILITOT 0.4  PROT 5.7*  ALBUMIN 2.2*   No  results for input(s): LIPASE, AMYLASE in the last 168 hours. No results for input(s): AMMONIA in the last 168 hours. Coagulation Profile: No results for input(s): INR, PROTIME in the last 168 hours. Cardiac Enzymes: No results for input(s): CKTOTAL, CKMB, CKMBINDEX, TROPONINI in the last 168 hours. BNP (last 3 results) No results for input(s): PROBNP in the last 8760 hours. HbA1C: No results for input(s): HGBA1C in the last 72 hours. CBG: Recent Labs  Lab 12/03/19 1517  GLUCAP 150*   Lipid Profile: Recent Labs    12/03/19 1541  TRIG 96   Thyroid Function Tests: No results for input(s): TSH, T4TOTAL, FREET4, T3FREE, THYROIDAB in the last 72 hours. Anemia Panel: Recent Labs    12/03/19 1541  FERRITIN 75   Urine analysis: No results found for: COLORURINE, APPEARANCEUR, LABSPEC, PHURINE, GLUCOSEU, HGBUR, BILIRUBINUR, KETONESUR, PROTEINUR, UROBILINOGEN, NITRITE, LEUKOCYTESUR  Radiological Exams on Admission: DG Chest Port 1 View  Result Date: 12/03/2019 CLINICAL DATA:  Hypoxia.  Reported COVID-19 positive EXAM: PORTABLE CHEST 1 VIEW COMPARISON:  December 21, 2018 FINDINGS: There is scarring in the left base with questionable small left effusion. There is also mild scarring in the right base. There is ill-defined airspace opacity in the left lower lung region. Heart is upper normal in size with  pulmonary vascularity normal. No adenopathy. No bone lesions. IMPRESSION: Ill-defined airspace opacity left lower lobe consistent with pneumonia. Suspect atypical organism pneumonia given this appearance. Scarring in the lung bases with questionable scarring versus small pleural effusion lateral left base. Stable cardiac silhouette. Electronically Signed   By: Lowella Grip III M.D.   On: 12/03/2019 15:20    EKG: Independently reviewed.  Assessment/Plan Principal Problem:   Acute hypoxemic respiratory failure due to COVID-19 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 COVID-19 with new O2 requirement - 1. COVID pathway 2. remdesivir 3. Stress dose steroids with solucortef given chronic steroid use 4. Tele monitor 5. Cont pulse ox 6. Daily labs 2. Chronic steroid use - 1. Starting stress dose steroids, got 125mg  solumedrol in ED 3. ? CAP - 1. Will leave on rocephin for the moment 2. BCx pending 3. Repeat CBC in AM to trend WBC 4. HTN - hold home BP meds, BP is on soft side 5. RCC - 1. Holding Carbometx due to LFT elevations, though I suspect LFT elevations today are just secondary to COVID 6. DM2 - 1. Hold home meds 2. Mod scale SSI AC/HS  DVT prophylaxis: Lovenox Code Status: Full Family Communication: no family in room Disposition Plan: Home after O2 requirement improved Consults called: None Admission status: Admit to inpatient  Severity of Illness: The appropriate patient status for this patient is INPATIENT. Inpatient status is judged to be reasonable and necessary in order to provide the required  intensity of service to ensure the patient's safety. The patient's presenting symptoms, physical exam findings, and initial radiographic and laboratory data in the context of their chronic comorbidities is felt to place them at high risk for further clinical deterioration. Furthermore, it is not anticipated that the patient will be medically stable for discharge from the hospital within 2 midnights  of admission. The following factors support the patient status of inpatient.   IP status due to new O2 requirement associated with [id number]-[id number]   * I certify that at the point of admission it is my clinical judgment that the patient will require inpatient hospital care spanning beyond 2 midnights from the point of admission due to high intensity of service, high risk for further deterioration and high frequency of surveillance required.*    Izzy Courville M. DO [facility]  How to contact the [facility] Attending or Consulting provider [place] or covering provider during after hours [place], for this patient?  1. Check the care team in [facility] and look for a) attending/consulting [facility] provider listed and b) the [facility] team listed 2. Log into [email or web address]  Amion Physician Scheduling and messaging for groups and whole hospitals  On call and physician scheduling software for group practices, residents, hospitalists and other medical providers for call, clinic, rotation and shift schedules. On[employer] is a hospital-wide system for scheduling doctors and paging doctors on call. EasyPlot is for scientific plotting and data analysis.  [email or web address]  and use [place]'s universal password to access. If you do not have the password, please contact the hospital operator.  3. Locate the [facility] provider you are looking for under [facility] and page to a number that you can be directly reached. 4. If you still have difficulty reaching the provider, please page the [facility] (Director on Call) for the Hospitalists  listed on amion for assistance.  12/03/2019, 8:58 PM

## 2019-12-03 NOTE — ED Notes (Signed)
Transport requested for pt

## 2019-12-03 NOTE — ED Notes (Signed)
Attempted to call report x 1  

## 2019-12-03 NOTE — ED Triage Notes (Signed)
Patient arrives to ED with [facility] with complaints of being COVID positive. Per pt he was swabbed positive this past Thursday. Pt states that hes had continues fevers and nausea. Per pt last night he became severely SOB and that continued on throughout today. Per was found to to be at 82% SpO2 on EMS arrival.

## 2019-12-04 ENCOUNTER — Inpatient Hospital Stay (HOSPITAL_COMMUNITY): Payer: Medicare HMO

## 2019-12-04 DIAGNOSIS — R7989 Other specified abnormal findings of blood chemistry: Secondary | ICD-10-CM

## 2019-12-04 LAB — COMPREHENSIVE METABOLIC PANEL
ALT: 161 U/L — ABNORMAL HIGH (ref 0–44)
AST: 192 U/L — ABNORMAL HIGH (ref 15–41)
Albumin: 2.2 g/dL — ABNORMAL LOW (ref 3.5–5.0)
Alkaline Phosphatase: 72 U/L (ref 38–126)
Anion gap: 11 (ref 5–15)
BUN: 24 mg/dL — ABNORMAL HIGH (ref 8–23)
CO2: 22 mmol/L (ref 22–32)
Calcium: 7.2 mg/dL — ABNORMAL LOW (ref 8.9–10.3)
Chloride: 101 mmol/L (ref 98–111)
Creatinine, Ser: 1.28 mg/dL — ABNORMAL HIGH (ref 0.61–1.24)
GFR, Estimated: 56 mL/min — ABNORMAL LOW (ref >60)
Glucose, Bld: 307 mg/dL — ABNORMAL HIGH (ref 70–99)
Potassium: 5.4 mmol/L — ABNORMAL HIGH (ref 3.5–5.1)
Sodium: 134 mmol/L — ABNORMAL LOW (ref 135–145)
Total Bilirubin: 0.7 mg/dL (ref 0.3–1.2)
Total Protein: 5.7 g/dL — ABNORMAL LOW (ref 6.5–8.1)

## 2019-12-04 LAB — CBC WITH DIFFERENTIAL/PLATELET
Abs Immature Granulocytes: 0.01 10*3/uL (ref 0.00–0.07)
Basophils Absolute: 0 10*3/uL (ref 0.0–0.1)
Basophils Relative: 0 %
Eosinophils Absolute: 0 10*3/uL (ref 0.0–0.5)
Eosinophils Relative: 0 %
HCT: 34.4 % — ABNORMAL LOW (ref 39.0–52.0)
Hemoglobin: 10.8 g/dL — ABNORMAL LOW (ref 13.0–17.0)
Immature Granulocytes: 0 %
Lymphocytes Relative: 19 %
Lymphs Abs: 0.6 10*3/uL — ABNORMAL LOW (ref 0.7–4.0)
MCH: 27.1 pg (ref 26.0–34.0)
MCHC: 31.4 g/dL (ref 30.0–36.0)
MCV: 86.4 fL (ref 80.0–100.0)
Monocytes Absolute: 0.1 10*3/uL (ref 0.1–1.0)
Monocytes Relative: 3 %
Neutro Abs: 2.6 10*3/uL (ref 1.7–7.7)
Neutrophils Relative %: 78 %
Platelets: 156 10*3/uL (ref 150–400)
RBC: 3.98 MIL/uL — ABNORMAL LOW (ref 4.22–5.81)
RDW: 19.2 % — ABNORMAL HIGH (ref 11.5–15.5)
WBC: 3.3 10*3/uL — ABNORMAL LOW (ref 4.0–10.5)
nRBC: 0 % (ref 0.0–0.2)

## 2019-12-04 LAB — GLUCOSE, CAPILLARY
Glucose-Capillary: 280 mg/dL — ABNORMAL HIGH (ref 70–99)
Glucose-Capillary: 199 mg/dL — ABNORMAL HIGH (ref 70–99)
Glucose-Capillary: 204 mg/dL — ABNORMAL HIGH (ref 70–99)
Glucose-Capillary: 152 mg/dL — ABNORMAL HIGH (ref 70–99)

## 2019-12-04 LAB — TSH: TSH: 1.603 u[IU]/mL (ref 0.350–4.500)

## 2019-12-04 LAB — HEMOGLOBIN A1C
Hgb A1c MFr Bld: 11.3 % — ABNORMAL HIGH (ref 4.8–5.6)
Mean Plasma Glucose: 278 mg/dL

## 2019-12-04 LAB — MAGNESIUM: Magnesium: 2.1 mg/dL (ref 1.7–2.4)

## 2019-12-04 LAB — BRAIN NATRIURETIC PEPTIDE: B Natriuretic Peptide: 86 pg/mL (ref 0.0–100.0)

## 2019-12-04 LAB — PROCALCITONIN: Procalcitonin: 0.76 ng/mL

## 2019-12-04 LAB — C-REACTIVE PROTEIN: CRP: 16.7 mg/dL — ABNORMAL HIGH (ref <1.0)

## 2019-12-04 LAB — D-DIMER, QUANTITATIVE: D-Dimer, Quant: 1.47 ug/mL-FEU — ABNORMAL HIGH (ref 0.00–0.50)

## 2019-12-04 MED ORDER — METHYLPREDNISOLONE SODIUM SUCC 125 MG IJ SOLR: 60.0000 mg | Freq: Two times a day (BID) | INTRAMUSCULAR | Status: DC

## 2019-12-04 MED ORDER — SODIUM ZIRCONIUM CYCLOSILICATE 10 G PO PACK
10.0000 g | PACK | Freq: Two times a day (BID) | ORAL | Status: AC
  Administered 2019-12-04: 10 g via ORAL
  Filled 2019-12-04: qty 1

## 2019-12-04 MED ORDER — SODIUM CHLORIDE 0.9 % IV SOLN
2.0000 g | INTRAVENOUS | Status: DC
  Administered 2019-12-04 – 2019-12-06 (×3): 2 g via INTRAVENOUS
  Filled 2019-12-04 (×3): qty 20

## 2019-12-04 MED ORDER — VITAMIN B-12 100 MCG PO TABS
50.0000 ug | ORAL_TABLET | Freq: Every day | ORAL | Status: DC
  Administered 2019-12-04 – 2019-12-07 (×4): 50 ug via ORAL
  Filled 2019-12-04 (×4): qty 1

## 2019-12-04 MED ORDER — SODIUM POLYSTYRENE SULFONATE 15 GM/60ML PO SUSP
30.0000 g | Freq: Once | ORAL | Status: AC
  Administered 2019-12-04: 30 g via ORAL
  Filled 2019-12-04: qty 120

## 2019-12-04 MED ORDER — CABOZANTINIB S-MALATE 20 MG PO TABS
20.0000 mg | ORAL_TABLET | Freq: Every day | ORAL | Status: DC
  Administered 2019-12-04 – 2019-12-06 (×3): 20 mg via ORAL
  Filled 2019-12-04 (×4): qty 1

## 2019-12-04 MED ORDER — DOXYCYCLINE HYCLATE 100 MG PO TABS
100.0000 mg | ORAL_TABLET | Freq: Two times a day (BID) | ORAL | Status: DC
  Administered 2019-12-04 – 2019-12-07 (×7): 100 mg via ORAL
  Filled 2019-12-04 (×7): qty 1

## 2019-12-04 MED ORDER — NITROGLYCERIN 0.4 MG SL SUBL: 0.4000 mg | SUBLINGUAL_TABLET | SUBLINGUAL | Status: DC | PRN

## 2019-12-04 MED ORDER — SODIUM CHLORIDE 0.9 % IV SOLN: INTRAVENOUS | Status: DC | PRN

## 2019-12-04 MED ORDER — INSULIN ASPART 100 UNIT/ML ~~LOC~~ SOLN
5.0000 [IU] | Freq: Three times a day (TID) | SUBCUTANEOUS | Status: DC
  Administered 2019-12-04 – 2019-12-05 (×6): 5 [IU] via SUBCUTANEOUS

## 2019-12-04 MED ORDER — ATORVASTATIN CALCIUM 40 MG PO TABS
40.0000 mg | ORAL_TABLET | Freq: Every day | ORAL | Status: DC
  Administered 2019-12-04 – 2019-12-07 (×4): 40 mg via ORAL
  Filled 2019-12-04 (×4): qty 1

## 2019-12-04 MED ORDER — GLIPIZIDE 5 MG PO TABS
10.0000 mg | ORAL_TABLET | Freq: Every day | ORAL | Status: DC
  Administered 2019-12-04 – 2019-12-05 (×2): 10 mg via ORAL
  Filled 2019-12-04 (×2): qty 2

## 2019-12-04 MED ORDER — LIVING WELL WITH DIABETES BOOK
Freq: Once | Status: AC
  Filled 2019-12-04: qty 1

## 2019-12-04 MED ORDER — ENSURE MAX PROTEIN PO LIQD
11.0000 [oz_av] | Freq: Two times a day (BID) | ORAL | Status: DC
  Administered 2019-12-05 – 2019-12-07 (×3): 11 [oz_av] via ORAL
  Filled 2019-12-04 (×7): qty 330

## 2019-12-04 MED ORDER — METHYLPREDNISOLONE SODIUM SUCC 40 MG IJ SOLR
40.0000 mg | Freq: Every day | INTRAMUSCULAR | Status: DC
  Administered 2019-12-04 – 2019-12-07 (×4): 40 mg via INTRAVENOUS
  Filled 2019-12-04 (×4): qty 1

## 2019-12-04 MED ORDER — FENOFIBRATE 160 MG PO TABS
160.0000 mg | ORAL_TABLET | Freq: Every day | ORAL | Status: DC
  Administered 2019-12-04 – 2019-12-07 (×4): 160 mg via ORAL
  Filled 2019-12-04 (×4): qty 1

## 2019-12-04 MED ORDER — ENOXAPARIN SODIUM 40 MG/0.4ML ~~LOC~~ SOLN
40.0000 mg | Freq: Two times a day (BID) | SUBCUTANEOUS | Status: DC
  Administered 2019-12-04 (×2): 40 mg via SUBCUTANEOUS
  Filled 2019-12-04 (×2): qty 0.4

## 2019-12-04 MED ORDER — METHYLPREDNISOLONE SODIUM SUCC 40 MG IJ SOLR: 40.0000 mg | Freq: Two times a day (BID) | INTRAMUSCULAR | Status: DC

## 2019-12-04 MED ORDER — ASPIRIN 325 MG PO TABS
325.0000 mg | ORAL_TABLET | Freq: Every day | ORAL | Status: DC
  Administered 2019-12-04 – 2019-12-07 (×4): 325 mg via ORAL
  Filled 2019-12-04 (×4): qty 1

## 2019-12-04 MED ORDER — INSULIN GLARGINE 100 UNIT/ML ~~LOC~~ SOLN
25.0000 [IU] | Freq: Every day | SUBCUTANEOUS | Status: DC
  Administered 2019-12-04 – 2019-12-05 (×2): 25 [IU] via SUBCUTANEOUS
  Filled 2019-12-04 (×3): qty 0.25

## 2019-12-04 MED ORDER — FLUDROCORTISONE ACETATE 0.1 MG PO TABS
0.0500 mg | ORAL_TABLET | Freq: Every day | ORAL | Status: DC
  Administered 2019-12-04 – 2019-12-07 (×4): 0.05 mg via ORAL
  Filled 2019-12-04 (×4): qty 0.5

## 2019-12-04 NOTE — Progress Notes (Signed)
PROGRESS NOTE                                                                                                                                                                                                             Patient Demographics:    Kevin Hawkins, is a 74 y.o. male, DOB - May 23, 1945, KJZ:[id number]  Outpatient Primary MD for the patient is Hamrick, Lorin Mercy, MD    LOS - 1  Admit date - 12/03/2019    Chief Complaint  Patient presents with  . Covid Positive       Brief Narrative (HPI from H&P) - 74 y.o. 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  Was diagnosed with COVID-19 pneumonia along with hypoxic respiratory failure and admitted to the hospital    Subjective:    Kevin Hawkins today has, No headache, No chest pain, No abdominal pain - No Nausea, No new weakness tingling or numbness, no SOB.   Assessment  & Plan :     1. Acute Hypoxic Resp. Failure due to Acute Covid 19 Viral Pneumonitis during the ongoing 2020 Covid 19 Pandemic - he is unfortunately not vaccinated and immunocompromised due to prolonged steroid use at home, fortunately he is responding well to steroid and remdesivir treatment which will be continued.  Currently on oxygen upon ambulation.  Monitor closely.  Encouraged the patient to sit up in chair in the daytime use I-S and flutter valve for pulmonary toiletry and then prone in bed when at night.  Will advance activity and titrate down oxygen as possible.   Recent Labs  Lab 12/03/19 1415 12/03/19 1541 12/04/19 0749  WBC  --  4.9 3.3*  HGB  --  10.0* 10.8*  HCT  --  33.2* 34.4*  PLT  --   160 156  CRP  --  12.9* 16.7*  BNP  --   --  86.0  DDIMER  --  1.53* 1.47*  PROCALCITON  --  1.04 0.76  AST  --  287* 192*  ALT  --  182* 161*  ALKPHOS  --  74 72  BILITOT  --  0.4 0.7  ALBUMIN  --  2.2* 2.2*  LATICACIDVEN  --  1.3  --   SARSCOV2NAA POSITIVE*  --   --     2.  History of myasthenia gravis, supposed to be on long-term steroids however abruptly stopped taking steroids about a week ago.  He said his PCP make him stop the steroids as his sugars were running high, counseled not to abruptly stop steroids in the future.  For now on IV steroids and doing well, if blood pressure drop steroid dose can be increased, after discharge follow-up with his rheumatologist within a week.  3.  Patient for Bacterial pneumonia upon admission.  Short 4-day course of Rocephin and oral doxycycline.  Procalcitonin trending down.  No productive cough or leukocytosis.  4.  Dyslipidemia on statin and fenofibrate.  5.  GERD.  PPI.  6.  CKD 3B.  Baseline creatinine 1.3.  Stable.  7.  Hyperkalemia.  Treated with Kayexalate and Lokelma.  Repeat BMP tomorrow.  8.  Mildly elevated D-dimer due to inflammation.  Leg ultrasound unremarkable, moderate dose Lovenox and monitor.  Clinical suspicion for PE extremely low.  9.  Hypothyroidism.  On Synthroid with stable TSH.  10.  DM type II.  Poor outpatient control due to hyperglycemia.  Patient most likely will require insulin upon discharge as he has had problems keeping her sugars in check when he is on his home dose prednisone.  For now placed on home dose of Glucotrol, Lantus added along with sliding scale.  Diabetic education will be provided along with insulin education.  Lab Results  Component Value Date   HGBA1C 7.9 (H) 07/21/2014   CBG (last 3)  Recent Labs    12/03/19 1517 12/03/19 2244 12/04/19 0736  GLUCAP 150* 367* 280*       Condition - Fair  Family Communication  : Wife Kevin Hawkins [phone] on 12/04/2019 at 11:33 AM message  left  Code Status :  Full  Consults  :  None  Procedures  :    Leg [place] - No DVT  PUD Prophylaxis : PPI  Disposition Plan  :    Status is: Inpatient  Remains inpatient appropriate because:IV treatments appropriate due to intensity of illness or inability to take PO   Dispo: The patient is from: Home              Anticipated d/c is to: Home              Anticipated d/c date is: > 3 days              Patient currently is not medically stable to d/c.   DVT Prophylaxis  :  Lovenox    Lab Results  Component Value Date   PLT 156 12/04/2019    Diet :  Diet Order            Diet Carb Modified Fluid consistency: Thin; Room service appropriate? Yes  Diet effective now                  Inpatient Medications  Scheduled Meds: . aspirin  325 mg Oral Daily  . atorvastatin  40 mg Oral Daily  . enoxaparin (LOVENOX) injection  40 mg Subcutaneous Q12H  . fenofibrate  160 mg Oral Daily  . fludrocortisone  0.05 mg Oral Daily  . glipiZIDE  10 mg Oral Q breakfast  . insulin aspart  0-15 Units Subcutaneous TID WC  . insulin aspart  0-5 Units Subcutaneous QHS  . insulin aspart  5 Units Subcutaneous TID WC  . insulin glargine  25 Units Subcutaneous Daily  . levothyroxine  125 mcg Oral Daily  . methylPREDNISolone (SOLU-MEDROL) injection  40 mg Intravenous Daily  . pantoprazole  40 mg Oral Daily  . sodium polystyrene  30 g Oral Once  . sodium zirconium cyclosilicate  10 g Oral BID  . vitamin B-12  50 mcg Oral Daily   Continuous Infusions: . calcium gluconate    . cefTRIAXone (ROCEPHIN)  IV    . remdesivir 100 mg in NS 100 mL 100 mg (12/04/19 0921)   PRN Meds:.ALPRAZolam, chlorpheniramine-HYDROcodone, guaiFENesin-dextromethorphan, nitroGLYCERIN, [DISCONTINUED] ondansetron **OR** ondansetron (ZOFRAN) IV  Antibiotics  :    Anti-infectives (From admission, onward)   Start     Dose/Rate Route Frequency Ordered Stop   12/04/19 1915  cefTRIAXone (ROCEPHIN) 1 g in sodium chloride  0.9 % 100 mL IVPB        1 g 200 mL/hr over 30 Minutes Intravenous Every 24 hours 12/03/19 1951     12/04/19 1000  remdesivir 100 mg in sodium chloride 0.9 % 100 mL IVPB       "Followed by" Linked Group Details   100 mg 200 mL/hr over 30 Minutes Intravenous Daily 12/03/19 1423 12/08/19 0959   12/03/19 2000  azithromycin (ZITHROMAX) 500 mg in sodium chloride 0.9 % 250 mL IVPB  Status:  Discontinued        500 mg 250 mL/hr over 60 Minutes Intravenous Every 24 hours 12/03/19 1951 12/03/19 1955   12/03/19 1915  cefTRIAXone (ROCEPHIN) 1 g in sodium chloride 0.9 % 100 mL IVPB        1 g 200 mL/hr over 30 Minutes Intravenous  Once 12/03/19 1909 12/03/19 1955   12/03/19 1915  azithromycin (ZITHROMAX) 500 mg in sodium chloride 0.9 % 250 mL IVPB  Status:  Discontinued        500 mg 250 mL/hr over 60 Minutes Intravenous  Once 12/03/19 1909 12/03/19 1949   12/03/19 1600  remdesivir 200 mg in sodium chloride 0.9% 250 mL IVPB       "Followed by" Linked Group Details   200 mg 580 mL/hr over 30 Minutes Intravenous Once 12/03/19 1423 12/03/19 1630       Time Spent in minutes  30   Lala Lund M.D on 12/04/2019 at 11:33 AM  To page go to [email or web address] - password [facility]  [facility] -  Office  [phone]   See all Orders from today for further details    Objective:   Vitals:   12/03/19 2100 12/03/19 2215 12/04/19 0513 12/04/19 0730  BP: 105/60 126/60 120/75   Pulse: 74 76 73   Resp: 19 20 19    Temp:  97.8 F (36.6 C) 98 F (36.7 C)   TempSrc: Oral Oral Oral   SpO2: 100% 98% 99% 98%  Weight:      Height:        Wt Readings from Last 3 Encounters:  12/03/19 79.4 kg  11/03/15 75.6 kg  07/20/14 75.4 kg     Intake/Output Summary (Last 24 hours) at 12/04/2019 1133 Last data filed at 12/04/2019 0841 Gross per 24 hour  Intake 240 ml  Output 200 ml  Net 40 ml     Physical Exam  Awake Alert, No new F.N deficits, Normal affect Wallace.AT,PERRAL Supple Neck,No JVD, No  cervical lymphadenopathy appriciated.  Symmetrical Chest wall movement, Good air movement bilaterally, CTAB RRR,No Gallops,Rubs  or new Murmurs, No Parasternal Heave +ve B.Sounds, Abd Soft, No tenderness, No organomegaly appriciated, No rebound - guarding or rigidity. No Cyanosis, Clubbing or edema, No new Rash or bruise      Data Review:    CBC Recent Labs  Lab 12/03/19 1541 12/04/19 0749  WBC 4.9 3.3*  HGB 10.0* 10.8*  HCT 33.2* 34.4*  PLT 160 156  MCV 90.5 86.4  MCH 27.2 27.1  MCHC 30.1 31.4  RDW 19.4* 19.2*  LYMPHSABS 1.3 0.6*  MONOABS 0.3 0.1  EOSABS 0.0 0.0  BASOSABS 0.0 0.0    Recent Labs  Lab 12/03/19 1541 12/04/19 0749 12/04/19 0835  NA 133* 134*  --   K 4.3 5.4*  --   CL 99 101  --   CO2 23 22  --   GLUCOSE 161* 307*  --   BUN 16 24*  --   CREATININE 1.35* 1.28*  --   CALCIUM 6.9* 7.2*  --   AST 287* 192*  --   ALT 182* 161*  --   ALKPHOS 74 72  --   BILITOT 0.4 0.7  --   ALBUMIN 2.2* 2.2*  --   MG  --  2.1  --   CRP 12.9* 16.7*  --   DDIMER 1.53* 1.47*  --   PROCALCITON 1.04 0.76  --   LATICACIDVEN 1.3  --   --   TSH  --   --  1.603  BNP  --  86.0  --     ------------------------------------------------------------------------------------------------------------------ Recent Labs    12/03/19 1541  TRIG 96    Lab Results  Component Value Date   HGBA1C 7.9 (H) 07/21/2014   ------------------------------------------------------------------------------------------------------------------ Recent Labs    12/04/19 0835  TSH 1.603    Cardiac Enzymes No results for input(s): CKMB, TROPONINI, MYOGLOBIN in the last 168 hours.  Invalid input(s): CK ------------------------------------------------------------------------------------------------------------------    Component Value Date/Time   BNP 86.0 12/04/2019 0749    Micro Results Recent Results (from the past 240 hour(s))  Respiratory Panel by RT PCR (Flu A&B, Covid) -  Nasopharyngeal Swab     Status: Abnormal   Collection Time: 12/03/19  2:15 PM   Specimen: Nasopharyngeal Swab  Result Value Ref Range Status   SARS Coronavirus 2 by RT PCR POSITIVE (A) NEGATIVE Final    Comment: RESULT CALLED TO, READ BACK BY AND VERIFIED WITH: RN A DAVIDSON [id number] AT 8453 BY CM (NOTE) SARS-CoV-2 target nucleic acids are DETECTED.  SARS-CoV-2 RNA is generally detectable in upper respiratory specimens  during the acute phase of infection. Positive results are indicative of the presence of the identified virus, but do not rule out bacterial infection or co-infection with other pathogens not detected by the test. Clinical correlation with patient history and other diagnostic information is necessary to determine patient infection status. The expected result is Negative.  Fact Sheet for Patients:  PinkCheek.be  Fact Sheet for Healthcare Providers: GravelBags.it  This test is not yet approved or cleared by the [place] FDA and  has been authorized for detection and/or diagnosis of SARS-CoV-2 by FDA under an Emergency Use Authorization (EUA).  This EUA will remain in effect (meaning this test can be  used) for the duration of  the COVID-19 declaration under Section 564(b)(1) of the Act, 21 U.S.C. section 360bbb-3(b)(1), unless the authorization is terminated or revoked sooner.      Influenza A by PCR NEGATIVE NEGATIVE Final   Influenza B by PCR NEGATIVE NEGATIVE Final  Comment: (NOTE) The Xpert Xpress SARS-CoV-2/FLU/RSV assay is intended as an aid in  the diagnosis of influenza from Nasopharyngeal swab specimens and  should not be used as a sole basis for treatment. Nasal washings and  aspirates are unacceptable for Xpert Xpress SARS-CoV-2/FLU/RSV  testing.  Fact Sheet for Patients: PinkCheek.be  Fact Sheet for Healthcare  Providers: GravelBags.it  This test is not yet approved or cleared by the [place] FDA and  has been authorized for detection and/or diagnosis of SARS-CoV-2 by  FDA under an Emergency Use Authorization (EUA). This EUA will remain  in effect (meaning this test can be used) for the duration of the  Covid-19 declaration under Section 564(b)(1) of the Act, 21  U.S.C. section 360bbb-3(b)(1), unless the authorization is  terminated or revoked. Performed at [facility], [place] [address] [place], [place] [address]   Blood Culture (routine x 2)     Status: None (Preliminary result)   Collection Time: 12/03/19  2:20 PM   Specimen: BLOOD  Result Value Ref Range Status   Specimen Description BLOOD LEFT ANTECUBITAL  Final   Special Requests   Final    BOTTLES DRAWN AEROBIC AND ANAEROBIC Blood Culture adequate volume   Culture   Final    NO GROWTH < 24 HOURS Performed at [facility], [place] [address] [place], [place] [address]    Report Status PENDING  Incomplete  Blood Culture (routine x 2)     Status: None (Preliminary result)   Collection Time: 12/03/19  2:43 PM   Specimen: BLOOD RIGHT HAND  Result Value Ref Range Status   Specimen Description BLOOD RIGHT HAND  Final   Special Requests   Final    BOTTLES DRAWN AEROBIC AND ANAEROBIC Blood Culture results may not be optimal due to an inadequate volume of blood received in culture bottles   Culture   Final    NO GROWTH < 24 HOURS Performed at [facility], [place] [address] [place], [place] [address]    Report Status PENDING  Incomplete    Radiology Reports DG Chest Port 1 View  Result Date: 12/03/2019 CLINICAL DATA:  Hypoxia.  Reported COVID-19 positive EXAM: PORTABLE CHEST 1 VIEW COMPARISON:  December 21, 2018 FINDINGS: There is scarring in the left base with questionable small left effusion. There is also mild scarring in the right base. There is ill-defined airspace opacity in the left  lower lung region. Heart is upper normal in size with pulmonary vascularity normal. No adenopathy. No bone lesions. IMPRESSION: Ill-defined airspace opacity left lower lobe consistent with pneumonia. Suspect atypical organism pneumonia given this appearance. Scarring in the lung bases with questionable scarring versus small pleural effusion lateral left base. Stable cardiac silhouette. Electronically Signed   By: Lowella Grip III M.D.   On: 12/03/2019 15:20   VAS [place] LOWER EXTREMITY VENOUS (DVT)  Result Date: 12/04/2019  Lower Venous DVTStudy Other Indications: Rapid Rise in D Dimer. Risk Factors: + Covid. Performing Technologist: Griffin Basil RCT RDMS  Examination Guidelines: A complete evaluation includes B-mode imaging, spectral Doppler, color Doppler, and power Doppler as needed of all accessible portions of each vessel. Bilateral testing is considered an integral part of a complete examination. Limited examinations for reoccurring indications may be performed as noted. The reflux portion of the exam is performed with the patient in reverse Trendelenburg.  +---------+---------------+---------+-----------+----------+--------------+ RIGHT    CompressibilityPhasicitySpontaneityPropertiesThrombus Aging +---------+---------------+---------+-----------+----------+--------------+ CFV      Full  Yes      Yes                                 +---------+---------------+---------+-----------+----------+--------------+ SFJ      Full                                                        +---------+---------------+---------+-----------+----------+--------------+ FV Prox  Full                                                        +---------+---------------+---------+-----------+----------+--------------+ FV Mid   Full                                                        +---------+---------------+---------+-----------+----------+--------------+ FV DistalFull                                                         +---------+---------------+---------+-----------+----------+--------------+ PFV      Full                                                        +---------+---------------+---------+-----------+----------+--------------+ POP      Full           Yes      Yes                                 +---------+---------------+---------+-----------+----------+--------------+ PTV      Full                                                        +---------+---------------+---------+-----------+----------+--------------+ PERO     Full                                                        +---------+---------------+---------+-----------+----------+--------------+   +---------+---------------+---------+-----------+----------+--------------+ LEFT     CompressibilityPhasicitySpontaneityPropertiesThrombus Aging +---------+---------------+---------+-----------+----------+--------------+ CFV      Full           Yes      Yes                                 +---------+---------------+---------+-----------+----------+--------------+ SFJ  Full                                                        +---------+---------------+---------+-----------+----------+--------------+ FV Prox  Full                                                        +---------+---------------+---------+-----------+----------+--------------+ FV Mid   Full                                                        +---------+---------------+---------+-----------+----------+--------------+ FV DistalFull                                                        +---------+---------------+---------+-----------+----------+--------------+ PFV      Full                                                        +---------+---------------+---------+-----------+----------+--------------+ POP      Full           Yes      Yes                                  +---------+---------------+---------+-----------+----------+--------------+ PTV      Full                                                        +---------+---------------+---------+-----------+----------+--------------+ PERO     Full                                                        +---------+---------------+---------+-----------+----------+--------------+     Summary: RIGHT: - There is no evidence of deep vein thrombosis in the lower extremity.  - No cystic structure found in the popliteal fossa.  LEFT: - There is no evidence of deep vein thrombosis in the lower extremity.  - No cystic structure found in the popliteal fossa.  *See table(s) above for measurements and observations.    Preliminary

## 2019-12-04 NOTE — Progress Notes (Signed)
Bilateral Lower Ext. study completed.   See CVProc for preliminary results.   Basia Mcginty, RDMS, RVT 

## 2019-12-04 NOTE — Progress Notes (Addendum)
Inpatient Diabetes Program Recommendations  AACE/ADA: New Consensus Statement on Inpatient Glycemic Control (2015)  Target Ranges:  Prepandial:   less than 140 mg/dL      Peak postprandial:   less than 180 mg/dL (1-2 hours)      Critically ill patients:  140 - 180 mg/dL   Lab Results  Component Value Date   GLUCAP 199 (H) 12/04/2019   HGBA1C 7.9 (H) 07/21/2014    Review of Glycemic Control Results for JARETH, PARDEE (MRN [id number]) as of 12/04/2019 13:08  Ref. Range 12/03/2019 15:17 12/03/2019 22:44 12/04/2019 07:36 12/04/2019 11:50  Glucose-Capillary Latest Ref Range: 70 - 99 mg/dL 150 (H) 367 (H) 280 (H) 199 (H)   Diabetes history:  DM2 Outpatient Diabetes medications:  Novolin R 0-30 units tid Glipizide 10 mg daily Metformin 1000 mg qam and 500 qpm Current orders for Inpatient glycemic control:  Lantus 25 units daily Novolog 0-15 units tid Novolog 0-5 units qhs Novolog 5 units tid with meals  Solumedrol 40 mg q12h  Note:  Inpatient diabetes consult received for STAT diabetes education and insulin teaching.  A1C is still pending.  Patient is on insulin at home.  Will speak with patient about diabetes management.    Nursing: Please use each patient interaction to provide diabetes education. Please review Living Well with Diabetes booklet with the patient, have patient watch patient education videos on diabetes, and instruct on insulin administration. Please allow patient to be actively engaged with diabetes management by allowing patient to check own glucose and self-administer insulin injections. Diabetes Coordinator will follow up with patient and reinforce diabetes education.  Addendum @ 1352:  Spoke with patient via the phone.  He states he does not take Metformin or Glipizide but does take 2 different insulins at home.  He cannot remember the names of the insulins.  He gave me permission to call his wife.  Called and spoke with Mrs. Winningham.  She confirms he takes 2  different insulins but also cannot remember the names.  He takes 1 sliding scale insulin TID if CBG is > 130 mg/dL and takes another (she thinks it starts with an L) 10 units qhs.  She does not know what his A1C usually runs.  He does check his cbg's 4x a day.      Will continue to follow while inpatient.  Thank you, Reche Dixon, RN, BSN Diabetes Coordinator Inpatient Diabetes Program [phone] (team pager from 8a-5p)

## 2019-12-04 NOTE — Progress Notes (Signed)
Adjusting ceftriaxone to 2g for non-UTI indication.   Onnie Boer, PharmD, BCIDP, AAHIVP, CPP Infectious Disease Pharmacist 12/04/2019 12:59 PM

## 2019-12-04 NOTE — Progress Notes (Signed)
Initial Nutrition Assessment  DOCUMENTATION CODES:   Not applicable  INTERVENTION:  Provide Ensure Max po BID, each supplement provides 150 kcal and 30 grams of protein.   Encourage adequate PO intake.   NUTRITION DIAGNOSIS:   Increased nutrient needs related to catabolic illness (COVID) as evidenced by estimated needs.  GOAL:   Patient will meet greater than or equal to 90% of their needs  MONITOR:   PO intake, Supplement acceptance, Skin, Weight trends, Labs, I & O's  REASON FOR ASSESSMENT:   Malnutrition Screening Tool    ASSESSMENT:   74 y.o. male with medical history significant of HTN, RCC s/p nephrectomy, reoccurrence in 2017 now on suppressive immunotherapy, myasthenia gravis from immunotherapy, chronic steroid use, DM2 presented to the hospital with fever chills and shortness of breath along with cough. Diagnosed with COVID-19 pneumonia along with hypoxic respiratory failure.   Meal completion 80%. Pt has been tolerating his po diet well. RD to order nutritional supplements to aid in caloric and protein needs. Pt encouraged to eat his food at meals and to drink his supplements. Unable to complete Nutrition-Focused physical exam at this time. Labs and medications reviewed. CBGs 199-367 mg/dL. RD placed diet education handout "Counting Carbohydrates for People with Diabetes" from the Academy of Nutrition and Dietetics Manual in pt's discharge instructions. RD contact information given.   Diet Order:   Diet Order            Diet Carb Modified Fluid consistency: Thin; Room service appropriate? Yes  Diet effective now                 EDUCATION NEEDS:   Not appropriate for education at this time  Skin:  Skin Assessment: Reviewed RN Assessment  Last BM:  10/17  Height:   Ht Readings from Last 1 Encounters:  12/03/19 5\' 6"  (1.676 m)    Weight:   Wt Readings from Last 1 Encounters:  12/03/19 79.4 kg   BMI:  Body mass index is 28.25 kg/m.  Estimated  Nutritional Needs:   Kcal:  2000-2300  Protein:  100-110 grams  Fluid:  >/= 2 L/day  Corrin Parker, MS, RD, LDN RD pager number/after hours weekend pager number on Amion.

## 2019-12-05 DIAGNOSIS — U071 COVID-19: Principal | ICD-10-CM

## 2019-12-05 DIAGNOSIS — J9601 Acute respiratory failure with hypoxia: Secondary | ICD-10-CM

## 2019-12-05 DIAGNOSIS — N182 Chronic kidney disease, stage 2 (mild): Secondary | ICD-10-CM

## 2019-12-05 DIAGNOSIS — Z7952 Long term (current) use of systemic steroids: Secondary | ICD-10-CM

## 2019-12-05 LAB — BRAIN NATRIURETIC PEPTIDE: B Natriuretic Peptide: 86.8 pg/mL (ref 0.0–100.0)

## 2019-12-05 LAB — COMPREHENSIVE METABOLIC PANEL
ALT: 127 U/L — ABNORMAL HIGH (ref 0–44)
AST: 122 U/L — ABNORMAL HIGH (ref 15–41)
Albumin: 2.3 g/dL — ABNORMAL LOW (ref 3.5–5.0)
Alkaline Phosphatase: 67 U/L (ref 38–126)
Anion gap: 14 (ref 5–15)
BUN: 31 mg/dL — ABNORMAL HIGH (ref 8–23)
CO2: 21 mmol/L — ABNORMAL LOW (ref 22–32)
Calcium: 7.1 mg/dL — ABNORMAL LOW (ref 8.9–10.3)
Chloride: 103 mmol/L (ref 98–111)
Creatinine, Ser: 1.47 mg/dL — ABNORMAL HIGH (ref 0.61–1.24)
GFR, Estimated: 47 mL/min — ABNORMAL LOW (ref >60)
Glucose, Bld: 92 mg/dL (ref 70–99)
Potassium: 3.7 mmol/L (ref 3.5–5.1)
Sodium: 138 mmol/L (ref 135–145)
Total Bilirubin: 0.6 mg/dL (ref 0.3–1.2)
Total Protein: 5.9 g/dL — ABNORMAL LOW (ref 6.5–8.1)

## 2019-12-05 LAB — GLUCOSE, CAPILLARY
Glucose-Capillary: 108 mg/dL — ABNORMAL HIGH (ref 70–99)
Glucose-Capillary: 139 mg/dL — ABNORMAL HIGH (ref 70–99)
Glucose-Capillary: 142 mg/dL — ABNORMAL HIGH (ref 70–99)
Glucose-Capillary: 205 mg/dL — ABNORMAL HIGH (ref 70–99)
Glucose-Capillary: 54 mg/dL — ABNORMAL LOW (ref 70–99)
Glucose-Capillary: 69 mg/dL — ABNORMAL LOW (ref 70–99)

## 2019-12-05 LAB — CBC WITH DIFFERENTIAL/PLATELET
Abs Immature Granulocytes: 0.02 10*3/uL (ref 0.00–0.07)
Basophils Absolute: 0 10*3/uL (ref 0.0–0.1)
Basophils Relative: 0 %
Eosinophils Absolute: 0 10*3/uL (ref 0.0–0.5)
Eosinophils Relative: 0 %
HCT: 33.9 % — ABNORMAL LOW (ref 39.0–52.0)
Hemoglobin: 10.9 g/dL — ABNORMAL LOW (ref 13.0–17.0)
Immature Granulocytes: 0 %
Lymphocytes Relative: 18 %
Lymphs Abs: 0.9 10*3/uL (ref 0.7–4.0)
MCH: 27.3 pg (ref 26.0–34.0)
MCHC: 32.2 g/dL (ref 30.0–36.0)
MCV: 85 fL (ref 80.0–100.0)
Monocytes Absolute: 0.3 10*3/uL (ref 0.1–1.0)
Monocytes Relative: 6 %
Neutro Abs: 3.7 10*3/uL (ref 1.7–7.7)
Neutrophils Relative %: 76 %
Platelets: 192 10*3/uL (ref 150–400)
RBC: 3.99 MIL/uL — ABNORMAL LOW (ref 4.22–5.81)
RDW: 19.2 % — ABNORMAL HIGH (ref 11.5–15.5)
WBC: 4.9 10*3/uL (ref 4.0–10.5)
nRBC: 0 % (ref 0.0–0.2)

## 2019-12-05 LAB — C-REACTIVE PROTEIN: CRP: 11.7 mg/dL — ABNORMAL HIGH (ref <1.0)

## 2019-12-05 LAB — D-DIMER, QUANTITATIVE: D-Dimer, Quant: 1.22 ug/mL-FEU — ABNORMAL HIGH (ref 0.00–0.50)

## 2019-12-05 LAB — PROCALCITONIN: Procalcitonin: 0.61 ng/mL

## 2019-12-05 LAB — MAGNESIUM: Magnesium: 2 mg/dL (ref 1.7–2.4)

## 2019-12-05 MED ORDER — ENOXAPARIN SODIUM 40 MG/0.4ML ~~LOC~~ SOLN
40.0000 mg | SUBCUTANEOUS | Status: DC
  Administered 2019-12-05 – 2019-12-06 (×2): 40 mg via SUBCUTANEOUS
  Filled 2019-12-05 (×2): qty 0.4

## 2019-12-05 MED ORDER — LOPERAMIDE HCL 2 MG PO CAPS: 2.0000 mg | ORAL_CAPSULE | ORAL | Status: DC | PRN

## 2019-12-05 NOTE — Progress Notes (Signed)
PROGRESS NOTE                                                                                                                                                                                                             Patient Demographics:    Kevin Hawkins, is a 74 y.o. male, DOB - [birth year], JAS:[id number]  Outpatient Primary MD for the patient is Hamrick, Lorin Mercy, MD    LOS - 2  Admit date - 12/03/2019    Chief Complaint  Patient presents with  . Covid Positive       Brief Narrative (HPI from H&P) - 74 y.o. 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  Was diagnosed with COVID-19 pneumonia along with hypoxic respiratory failure and admitted to the hospital    Subjective:    [name] today [place] any headache, chest pain, abdominal pain, no focal deficits, he denies any dyspnea today.    Assessment  & Plan :    Acute Hypoxic Resp. Failure due to Acute Covid 19 Viral Pneumonitis during the ongoing 2020 Covid 19 Pandemic  - he is unfortunately not vaccinated and immunocompromised due to prolonged steroid use at home, fortunately he is responding well to steroid and remdesivir treatment which will be continued. Currently tolerating room air at rest, requiring oxygen only with ambulation .  Encouraged the patient to sit up in chair in the daytime use I-S and flutter valve for pulmonary toiletry and then prone in bed when at night.  Will advance activity and titrate down oxygen as possible.   Recent Labs  Lab 12/03/19 1415 12/03/19 1541 12/04/19 0749 12/05/19 0541  WBC  --  4.9 3.3* 4.9  HGB  --  10.0* 10.8* 10.9*    HCT  --  33.2* 34.4* 33.9*  PLT  --  160 156 192  CRP  --  12.9* 16.7* 11.7*  BNP  --   --  86.0 86.8  DDIMER  --  1.53* 1.47* 1.22*  PROCALCITON  --  1.04 0.76 0.61  AST  --  287* 192* 122*  ALT  --  182* 161* 127*  ALKPHOS  --  74 72 70  BILITOT  --  0.4 0.7 0.6  ALBUMIN  --  2.2* 2.2* 2.3*  LATICACIDVEN  --  1.3  --   --   SARSCOV2NAA POSITIVE*  --   --   --     History of myasthenia gravis,  - supposed to be on long-term steroids however abruptly stopped taking steroids about a week ago.  He said his PCP make him stop the steroids as his sugars were running high, counseled not to abruptly stop steroids in the future.  For now on IV steroids and doing well, if blood pressure drop steroid dose can be increased, after discharge follow-up with his rheumatologist within a week.  Patient for Bacterial pneumonia upon admission.   -Short 4-day course of Rocephin and oral doxycycline.  Procalcitonin trending down.  No productive cough or leukocytosis.  Dyslipidemia on statin and fenofibrate.  GERD.  PPI.  CKD 3B.  Baseline creatinine 1.3.  Stable.  Hyperkalemia.  Treated with Kayexalate and Lokelma.  Repeat BMP tomorrow.  Mildly elevated D-dimer due to inflammation.  Leg ultrasound unremarkable, moderate dose Lovenox and monitor.  Clinical suspicion for PE extremely low.  Hypothyroidism.  On Synthroid with stable TSH.  DM type II.  Poor outpatient control due to hyperglycemia.  Patient most likely will require insulin upon discharge as he has had problems keeping her sugars in check when he is on his home dose prednisone.  For now placed on home dose of Glucotrol, Lantus added along with sliding scale.  Diabetic education will be provided along with insulin education.  Lab Results  Component Value Date   HGBA1C 11.3 (H) 12/04/2019   CBG (last 3)  Recent Labs    12/04/19 2109 12/05/19 0717 12/05/19 1216  GLUCAP 152* 108* 142*       Condition - Fair  Family  Communication  : Wife Enid Derry [phone] 12/05/2019  Code Status :  Full  Consults  :  None  Procedures  :    Leg [place] - No DVT  PUD Prophylaxis : PPI  Disposition Plan  :    Status is: Inpatient  Remains inpatient appropriate because:IV treatments appropriate due to intensity of illness or inability to take PO   Dispo: The patient is from: Home              Anticipated d/c is to: Home              Anticipated d/c date is: 2 days              Patient currently is not medically stable to d/c. After finishing IV steroids and Remdesivir   DVT Prophylaxis  :  Lovenox    Lab Results  Component Value Date   PLT 192 12/05/2019    Diet :  Diet Order            Diet Carb Modified Fluid consistency: Thin; Room service appropriate? Yes  Diet effective now                  Inpatient Medications  Scheduled Meds: . aspirin  325 mg Oral Daily  . atorvastatin  40 mg Oral Daily  . cabozantinib  20 mg Oral QHS  . doxycycline  100 mg Oral Q12H  . enoxaparin (LOVENOX) injection  40 mg Subcutaneous Q24H  . fenofibrate  160 mg Oral Daily  . fludrocortisone  0.05 mg Oral Daily  . glipiZIDE  10 mg Oral Q breakfast  . insulin aspart  0-15 Units Subcutaneous TID WC  . insulin aspart  0-5 Units Subcutaneous QHS  . insulin aspart  5 Units Subcutaneous TID WC  . insulin glargine  25 Units Subcutaneous Daily  . levothyroxine  125 mcg Oral Daily  . methylPREDNISolone (SOLU-MEDROL) injection  40 mg Intravenous Daily  . pantoprazole  40 mg Oral Daily  . Ensure Max Protein  11 oz Oral BID  . vitamin B-12  50 mcg Oral Daily   Continuous Infusions: . sodium chloride    . calcium gluconate    . cefTRIAXone (ROCEPHIN)  IV Stopped (12/04/19 1900)  . remdesivir 100 mg in NS 100 mL 100 mg (12/05/19 0801)   PRN Meds:.sodium chloride, ALPRAZolam, chlorpheniramine-HYDROcodone, guaiFENesin-dextromethorphan, nitroGLYCERIN, [DISCONTINUED] ondansetron **OR** ondansetron (ZOFRAN)  IV  Antibiotics  :    Anti-infectives (From admission, onward)   Start     Dose/Rate Route Frequency Ordered Stop   12/04/19 1915  cefTRIAXone (ROCEPHIN) 1 g in sodium chloride 0.9 % 100 mL IVPB  Status:  Discontinued        1 g 200 mL/hr over 30 Minutes Intravenous Every 24 hours 12/03/19 1951 12/04/19 1258   12/04/19 1915  cefTRIAXone (ROCEPHIN) 2 g in sodium chloride 0.9 % 100 mL IVPB        2 g 200 mL/hr over 30 Minutes Intravenous Every 24 hours 12/04/19 1258 12/07/19 2359   12/04/19 1230  doxycycline (VIBRA-TABS) tablet 100 mg        100 mg Oral Every 12 hours 12/04/19 1139 12/09/19 0959   12/04/19 1000  remdesivir 100 mg in sodium chloride 0.9 % 100 mL IVPB       "Followed by" Linked Group Details   100 mg 200 mL/hr over 30 Minutes Intravenous Daily 12/03/19 1423 12/08/19 0959   12/03/19 2000  azithromycin (ZITHROMAX) 500 mg in sodium chloride 0.9 % 250 mL IVPB  Status:  Discontinued        500 mg 250 mL/hr over 60 Minutes Intravenous Every 24 hours 12/03/19 1951 12/03/19 1955   12/03/19 1915  cefTRIAXone (ROCEPHIN) 1 g in sodium chloride 0.9 % 100 mL IVPB        1 g 200 mL/hr over 30 Minutes Intravenous  Once 12/03/19 1909 12/03/19 1955   12/03/19 1915  azithromycin (ZITHROMAX) 500 mg in sodium chloride 0.9 % 250 mL IVPB  Status:  Discontinued        500 mg 250 mL/hr over 60 Minutes Intravenous  Once 12/03/19 1909 12/03/19 1949   12/03/19 1600  remdesivir 200 mg in sodium chloride 0.9% 250 mL IVPB       "Followed by" Linked Group Details   200 mg 580 mL/hr over 30 Minutes Intravenous Once 12/03/19 1423 12/03/19 1630        Aneudy Champlain M.D on 12/05/2019 at 1:28 PM  To page go to [email or web address]  [facility] -  Office  [phone]   See all Orders from today for further details    Objective:   Vitals:   12/05/19 0200 12/05/19 0400 12/05/19 0434 12/05/19 0719  BP:   139/76 137/83  Pulse: 74 75 81 84  Resp: (!) 24 16 (!) 23 17  Temp:   97.9 F (36.6  C) 97.7 F (36.5 C)  TempSrc:   Oral   SpO2: 96% 94% 96% 98%  Weight:      Height:        Wt Readings from Last 3  Encounters:  12/03/19 79.4 kg  11/03/15 75.6 kg  07/20/14 75.4 kg     Intake/Output Summary (Last 24 hours) at 12/05/2019 1328 Last data filed at 12/05/2019 0835 Gross per 24 hour  Intake 880 ml  Output 250 ml  Net 630 ml     Physical Exam  Awake Alert, Oriented X 3, No new F.N deficits, Normal affect Symmetrical Chest wall movement, Good air movement bilaterally, CTAB RRR,No Gallops,Rubs or new Murmurs, No Parasternal Heave +ve B.Sounds, Abd Soft, No tenderness, No rebound - guarding or rigidity. No Cyanosis, Clubbing or edema, No new Rash or bruise       Data Review:    CBC Recent Labs  Lab 12/03/19 1541 12/04/19 0749 12/05/19 0541  WBC 4.9 3.3* 4.9  HGB 10.0* 10.8* 10.9*  HCT 33.2* 34.4* 33.9*  PLT 160 156 192  MCV 90.5 86.4 85.0  MCH 27.2 27.1 27.3  MCHC 30.1 31.4 32.2  RDW 19.4* 19.2* 19.2*  LYMPHSABS 1.3 0.6* 0.9  MONOABS 0.3 0.1 0.3  EOSABS 0.0 0.0 0.0  BASOSABS 0.0 0.0 0.0    Recent Labs  Lab 12/03/19 1541 12/04/19 0749 12/04/19 0835 12/05/19 0541  NA 133* 134*  --  138  K 4.3 5.4*  --  3.7  CL 99 101  --  103  CO2 23 22  --  21*  GLUCOSE 161* 307*  --  92  BUN 16 24*  --  31*  CREATININE 1.35* 1.28*  --  1.47*  CALCIUM 6.9* 7.2*  --  7.1*  AST 287* 192*  --  122*  ALT 182* 161*  --  127*  ALKPHOS 74 72  --  67  BILITOT 0.4 0.7  --  0.6  ALBUMIN 2.2* 2.2*  --  2.3*  MG  --  2.1  --  2.0  CRP 12.9* 16.7*  --  11.7*  DDIMER 1.53* 1.47*  --  1.22*  PROCALCITON 1.04 0.76  --  0.61  LATICACIDVEN 1.3  --   --   --   TSH  --   --  1.603  --   HGBA1C  --  11.3*  --   --   BNP  --  86.0  --  86.8    ------------------------------------------------------------------------------------------------------------------ Recent Labs    12/03/19 1541  TRIG 96    Lab Results  Component Value Date   HGBA1C 11.3 (H)  12/04/2019   ------------------------------------------------------------------------------------------------------------------ Recent Labs    12/04/19 0835  TSH 1.603    Cardiac Enzymes No results for input(s): CKMB, TROPONINI, MYOGLOBIN in the last 168 hours.  Invalid input(s): CK ------------------------------------------------------------------------------------------------------------------    Component Value Date/Time   BNP 86.8 12/05/2019 0541    Micro Results Recent Results (from the past 240 hour(s))  Respiratory Panel by RT PCR (Flu A&B, Covid) - Nasopharyngeal Swab     Status: Abnormal   Collection Time: 12/03/19  2:15 PM   Specimen: Nasopharyngeal Swab  Result Value Ref Range Status   SARS Coronavirus 2 by RT PCR POSITIVE (A) NEGATIVE Final    Comment: RESULT CALLED TO, READ BACK BY AND VERIFIED WITH: RN A DAVIDSON [id number] AT [id number] BY CM (NOTE) SARS-CoV-2 target nucleic acids are DETECTED.  SARS-CoV-2 RNA is generally detectable in upper respiratory specimens  during the acute phase of infection. Positive results are indicative of the presence of the identified virus, but do not rule out bacterial infection or co-infection with other pathogens not detected by the test. Clinical correlation with patient  history and other diagnostic information is necessary to determine patient infection status. The expected result is Negative.  Fact Sheet for Patients:  PinkCheek.be  Fact Sheet for Healthcare Providers: GravelBags.it  This test is not yet approved or cleared by the [place] FDA and  has been authorized for detection and/or diagnosis of SARS-CoV-2 by FDA under an Emergency Use Authorization (EUA).  This EUA will remain in effect (meaning this test can be  used) for the duration of  the COVID-19 declaration under Section 564(b)(1) of the Act, 21 U.S.C. section 360bbb-3(b)(1), unless the  authorization is terminated or revoked sooner.      Influenza A by PCR NEGATIVE NEGATIVE Final   Influenza B by PCR NEGATIVE NEGATIVE Final    Comment: (NOTE) The Xpert Xpress SARS-CoV-2/FLU/RSV assay is intended as an aid in  the diagnosis of influenza from Nasopharyngeal swab specimens and  should not be used as a sole basis for treatment. Nasal washings and  aspirates are unacceptable for Xpert Xpress SARS-CoV-2/FLU/RSV  testing.  Fact Sheet for Patients: PinkCheek.be  Fact Sheet for Healthcare Providers: GravelBags.it  This test is not yet approved or cleared by the [place] FDA and  has been authorized for detection and/or diagnosis of SARS-CoV-2 by  FDA under an Emergency Use Authorization (EUA). This EUA will remain  in effect (meaning this test can be used) for the duration of the  Covid-19 declaration under Section 564(b)(1) of the Act, 21  U.S.C. section 360bbb-3(b)(1), unless the authorization is  terminated or revoked. Performed at [facility], [place] [address] [place], [place] [address]   Blood Culture (routine x 2)     Status: None (Preliminary result)   Collection Time: 12/03/19  2:20 PM   Specimen: BLOOD  Result Value Ref Range Status   Specimen Description BLOOD LEFT ANTECUBITAL  Final   Special Requests   Final    BOTTLES DRAWN AEROBIC AND ANAEROBIC Blood Culture adequate volume   Culture   Final    NO GROWTH 2 DAYS Performed at [facility], [place] [address] [place], [place] [address]    Report Status PENDING  Incomplete  Blood Culture (routine x 2)     Status: None (Preliminary result)   Collection Time: 12/03/19  2:43 PM   Specimen: BLOOD RIGHT HAND  Result Value Ref Range Status   Specimen Description BLOOD RIGHT HAND  Final   Special Requests   Final    BOTTLES DRAWN AEROBIC AND ANAEROBIC Blood Culture results may not be optimal due to an inadequate volume of blood received  in culture bottles   Culture   Final    NO GROWTH 2 DAYS Performed at [facility], [place] [address], [place], [place] [address]    Report Status PENDING  Incomplete    Radiology Reports DG Chest Port 1 View  Result Date: 12/03/2019 CLINICAL DATA:  Hypoxia.  Reported COVID-19 positive EXAM: PORTABLE CHEST 1 VIEW COMPARISON:  December 21, 2018 FINDINGS: There is scarring in the left base with questionable small left effusion. There is also mild scarring in the right base. There is ill-defined airspace opacity in the left lower lung region. Heart is upper normal in size with pulmonary vascularity normal. No adenopathy. No bone lesions. IMPRESSION: Ill-defined airspace opacity left lower lobe consistent with pneumonia. Suspect atypical organism pneumonia given this appearance. Scarring in the lung bases with questionable scarring versus small pleural effusion lateral left base. Stable cardiac silhouette. Electronically Signed  By: Lowella Grip III M.D.   On: 12/03/2019 15:20   VAS [place] LOWER EXTREMITY VENOUS (DVT)  Result Date: 12/04/2019  Lower Venous DVTStudy Other Indications: Rapid Rise in D Dimer. Risk Factors: + Covid. Performing Technologist: Griffin Basil RCT RDMS  Examination Guidelines: A complete evaluation includes B-mode imaging, spectral Doppler, color Doppler, and power Doppler as needed of all accessible portions of each vessel. Bilateral testing is considered an integral part of a complete examination. Limited examinations for reoccurring indications may be performed as noted. The reflux portion of the exam is performed with the patient in reverse Trendelenburg.  +---------+---------------+---------+-----------+----------+--------------+ RIGHT    CompressibilityPhasicitySpontaneityPropertiesThrombus Aging +---------+---------------+---------+-----------+----------+--------------+ CFV      Full           Yes      Yes                                  +---------+---------------+---------+-----------+----------+--------------+ SFJ      Full                                                        +---------+---------------+---------+-----------+----------+--------------+ FV Prox  Full                                                        +---------+---------------+---------+-----------+----------+--------------+ FV Mid   Full                                                        +---------+---------------+---------+-----------+----------+--------------+ FV DistalFull                                                        +---------+---------------+---------+-----------+----------+--------------+ PFV      Full                                                        +---------+---------------+---------+-----------+----------+--------------+ POP      Full           Yes      Yes                                 +---------+---------------+---------+-----------+----------+--------------+ PTV      Full                                                        +---------+---------------+---------+-----------+----------+--------------+ PERO  Full                                                        +---------+---------------+---------+-----------+----------+--------------+   +---------+---------------+---------+-----------+----------+--------------+ LEFT     CompressibilityPhasicitySpontaneityPropertiesThrombus Aging +---------+---------------+---------+-----------+----------+--------------+ CFV      Full           Yes      Yes                                 +---------+---------------+---------+-----------+----------+--------------+ SFJ      Full                                                        +---------+---------------+---------+-----------+----------+--------------+ FV Prox  Full                                                         +---------+---------------+---------+-----------+----------+--------------+ FV Mid   Full                                                        +---------+---------------+---------+-----------+----------+--------------+ FV DistalFull                                                        +---------+---------------+---------+-----------+----------+--------------+ PFV      Full                                                        +---------+---------------+---------+-----------+----------+--------------+ POP      Full           Yes      Yes                                 +---------+---------------+---------+-----------+----------+--------------+ PTV      Full                                                        +---------+---------------+---------+-----------+----------+--------------+ PERO     Full                                                        +---------+---------------+---------+-----------+----------+--------------+  Summary: RIGHT: - There is no evidence of deep vein thrombosis in the lower extremity.  - No cystic structure found in the popliteal fossa.  LEFT: - There is no evidence of deep vein thrombosis in the lower extremity.  - No cystic structure found in the popliteal fossa.  *See table(s) above for measurements and observations. Electronically signed by Ruta Hinds MD on 12/04/2019 at 5:41:13 PM.    Final

## 2019-12-05 NOTE — Plan of Care (Signed)

## 2019-12-05 NOTE — Progress Notes (Signed)
Ok to reduce Lovenox to 40mg  SQ qday and add stop date of ceftriaxone/doxy for 5d total per Dr. Waldron Labs.  Onnie Boer, PharmD, BCIDP, AAHIVP, CPP Infectious Disease Pharmacist 12/05/2019 9:53 AM

## 2019-12-06 DIAGNOSIS — E119 Type 2 diabetes mellitus without complications: Secondary | ICD-10-CM

## 2019-12-06 LAB — CBC WITH DIFFERENTIAL/PLATELET
Abs Immature Granulocytes: 0.03 10*3/uL (ref 0.00–0.07)
Basophils Absolute: 0 10*3/uL (ref 0.0–0.1)
Basophils Relative: 0 %
Eosinophils Absolute: 0 10*3/uL (ref 0.0–0.5)
Eosinophils Relative: 0 %
HCT: 31.1 % — ABNORMAL LOW (ref 39.0–52.0)
Hemoglobin: 10 g/dL — ABNORMAL LOW (ref 13.0–17.0)
Immature Granulocytes: 1 %
Lymphocytes Relative: 21 %
Lymphs Abs: 1.2 10*3/uL (ref 0.7–4.0)
MCH: 27.9 pg (ref 26.0–34.0)
MCHC: 32.2 g/dL (ref 30.0–36.0)
MCV: 86.6 fL (ref 80.0–100.0)
Monocytes Absolute: 0.4 10*3/uL (ref 0.1–1.0)
Monocytes Relative: 6 %
Neutro Abs: 4.2 10*3/uL (ref 1.7–7.7)
Neutrophils Relative %: 72 %
Platelets: 211 10*3/uL (ref 150–400)
RBC: 3.59 MIL/uL — ABNORMAL LOW (ref 4.22–5.81)
RDW: 19.3 % — ABNORMAL HIGH (ref 11.5–15.5)
WBC: 5.8 10*3/uL (ref 4.0–10.5)
nRBC: 0 % (ref 0.0–0.2)

## 2019-12-06 LAB — GLUCOSE, CAPILLARY
Glucose-Capillary: 109 mg/dL — ABNORMAL HIGH (ref 70–99)
Glucose-Capillary: 148 mg/dL — ABNORMAL HIGH (ref 70–99)
Glucose-Capillary: 163 mg/dL — ABNORMAL HIGH (ref 70–99)
Glucose-Capillary: 170 mg/dL — ABNORMAL HIGH (ref 70–99)
Glucose-Capillary: 181 mg/dL — ABNORMAL HIGH (ref 70–99)
Glucose-Capillary: 241 mg/dL — ABNORMAL HIGH (ref 70–99)
Glucose-Capillary: 294 mg/dL — ABNORMAL HIGH (ref 70–99)
Glucose-Capillary: 54 mg/dL — ABNORMAL LOW (ref 70–99)
Glucose-Capillary: 76 mg/dL (ref 70–99)
Glucose-Capillary: 94 mg/dL (ref 70–99)
Glucose-Capillary: 98 mg/dL (ref 70–99)
Glucose-Capillary: 98 mg/dL (ref 70–99)

## 2019-12-06 LAB — C-REACTIVE PROTEIN: CRP: 7 mg/dL — ABNORMAL HIGH (ref <1.0)

## 2019-12-06 LAB — COMPREHENSIVE METABOLIC PANEL
ALT: 94 U/L — ABNORMAL HIGH (ref 0–44)
AST: 84 U/L — ABNORMAL HIGH (ref 15–41)
Albumin: 2 g/dL — ABNORMAL LOW (ref 3.5–5.0)
Alkaline Phosphatase: 58 U/L (ref 38–126)
Anion gap: 15 (ref 5–15)
BUN: 41 mg/dL — ABNORMAL HIGH (ref 8–23)
CO2: 21 mmol/L — ABNORMAL LOW (ref 22–32)
Calcium: 6.9 mg/dL — ABNORMAL LOW (ref 8.9–10.3)
Chloride: 102 mmol/L (ref 98–111)
Creatinine, Ser: 1.57 mg/dL — ABNORMAL HIGH (ref 0.61–1.24)
GFR, Estimated: 43 mL/min — ABNORMAL LOW (ref >60)
Glucose, Bld: 146 mg/dL — ABNORMAL HIGH (ref 70–99)
Potassium: 3.8 mmol/L (ref 3.5–5.1)
Sodium: 138 mmol/L (ref 135–145)
Total Bilirubin: 0.3 mg/dL (ref 0.3–1.2)
Total Protein: 5.2 g/dL — ABNORMAL LOW (ref 6.5–8.1)

## 2019-12-06 LAB — BRAIN NATRIURETIC PEPTIDE: B Natriuretic Peptide: 52.2 pg/mL (ref 0.0–100.0)

## 2019-12-06 LAB — PROCALCITONIN: Procalcitonin: 0.41 ng/mL

## 2019-12-06 LAB — D-DIMER, QUANTITATIVE: D-Dimer, Quant: 0.82 ug/mL-FEU — ABNORMAL HIGH (ref 0.00–0.50)

## 2019-12-06 LAB — MAGNESIUM: Magnesium: 1.7 mg/dL (ref 1.7–2.4)

## 2019-12-06 MED ORDER — HYDROCORTISONE NA SUCCINATE PF 100 MG IJ SOLR
100.0000 mg | Freq: Once | INTRAMUSCULAR | Status: AC
  Administered 2019-12-06: 100 mg via INTRAVENOUS
  Filled 2019-12-06: qty 2

## 2019-12-06 NOTE — Progress Notes (Signed)
   12/06/19 0526  Vitals  Temp 97.9 F (36.6 C)  Temp Source Oral  BP 122/78  MAP (mmHg) 92  BP Location Left Arm  BP Method Automatic  Patient Position (if appropriate) Lying  Pulse Rate 79  Pulse Rate Source Monitor  ECG Heart Rate 79  Resp (!) 21  MEWS COLOR  MEWS Score Color Green  Oxygen Therapy  SpO2 98 %  MEWS Score  MEWS Temp 0  MEWS Systolic 0  MEWS Pulse 0  MEWS RR 1  MEWS LOC 0  MEWS Score 1

## 2019-12-06 NOTE — Progress Notes (Signed)
Occupational Therapy Evaluation Patient Details Name: Kevin Hawkins MRN: [id number] DOB: [birth year] Today's Date: 12/06/2019    History of Present Illness Pt is a 74 y.o. male admitted 12/03/19 with 10-day h/o dever, chills, cough, SOB. Workup for hypoxic respiratory failure secondary to COVID-19 viral pneumonitis. PMH includes HTN, RCC s/p nephrectomy, myasthenia gravis from immunotherapy, DM2; pt unvaccinated.   Clinical Impression   PTA, pt lived with his wife/family and was independent with ADL and mobility with occasional use of a cane. Pt states that he has occasional double vision and that he has "never really recovered" from "being sick" - referring to myasthenia gravis and immunotherapy. Currently close to baseline function. SpO2 97-100; HR 87 with mobility and ADL tasks with 1/4 DOE. Pt has had 1 fall 3 months ago and has difficulty getting up from lower surfaces. 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  Recommend pt follow up with outpt therapy at the neuro outpt therapy for balance and strengthening. Will follow acutely to facilitate ase DC home and educate on strategies to reduce risk of falls.    Follow Up Recommendations  No OT follow up;Supervision - Intermittent (needs to follow up with his eye doctor; neuro outpt PT)    Equipment Recommendations  Tub/shower seat (pt states he will get one on his own)    Recommendations for Other Services       Precautions / Restrictions Precautions Precautions: Fall      Mobility Bed Mobility Overal bed mobility: Independent                  Transfers Overall transfer level: Needs assistance   Transfers: Sit to/from Stand;Stand Pivot Transfers Sit to Stand: Supervision Stand pivot transfers: Supervision            Balance Overall balance assessment: Mild deficits observed, not formally tested                                         ADL either performed or assessed with  clinical judgement   ADL Overall ADL's : Needs assistance/impaired     Grooming: Supervision/safety   Upper Body Bathing: Set up   Lower Body Bathing: Supervison/ safety;Sit to/from stand   Upper Body Dressing : Set up;Sitting   Lower Body Dressing: Supervision/safety;Sit to/from stand   Toilet Transfer: Supervision/safety   Toileting- Water quality scientist and Hygiene: Modified independent       Functional mobility during ADLs: Supervision/safety       Vision Baseline Vision/History: Wears glasses (double vision at times - most likely from [facility]) Additional Comments: Occasionally has double vision - most likely from MG; discussed need to follow up with eye doctor     Perception     Praxis      Pertinent Vitals/Pain Pain Assessment: No/denies pain     Hand Dominance Right   Extremity/Trunk Assessment Upper Extremity Assessment Upper Extremity Assessment: Generalized weakness;RUE deficits/detail;LUE deficits/detail RUE Deficits / Details: RTC impairment; ; @ 30 degrees abd; essentailly no FF; Has had difficulty for over a year RUE Sensation:  (tinglly on ends when "sugar jumps up") RUE Coordination: decreased gross motor LUE Deficits / Details: same as R   Lower Extremity Assessment Lower Extremity Assessment: Defer to PT evaluation   Cervical / Trunk Assessment Cervical / Trunk Assessment: Normal;Other exceptions (forward head)   Communication Communication Communication: [facility]   Cognition  Arousal/Alertness: Awake/alert Behavior During Therapy: WFL for tasks assessed/performed Overall Cognitive Status: Within Functional Limits for tasks assessed                                     General Comments  Enjoys  mowing his yard; 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    Exercises Exercises: Other exercises Other Exercises Other Exercises: encouraged use of incentive spirometer - able to pull 1250 ml   Shoulder Instructions       Home Living Family/patient expects to be discharged to:: Private residence Living Arrangements: Spouse/significant other;Other relatives Available Help at Discharge: Available 24 hours/day Type of Home: Mobile home Home Access: Ramped entrance     Home Layout: One level     Bathroom Shower/Tub: Occupational psychologist: Handicapped height Bathroom Accessibility: Yes How Accessible: Accessible via walker Home Equipment: [place] - 2 wheels;Cane - single point;Bedside commode;Grab bars - tub/shower          Prior Functioning/Environment Level of Independence: Independent;Needs assistance    ADL's / Homemaking Assistance Needed: wife occasionally helps with dressing due to B shoulder deficits; wife usually drives and helps him with medication management   Comments: occasionally uses a cane if he "walks a pretty good ways"        OT Problem List: Decreased strength;Decreased activity tolerance;Impaired balance (sitting and/or standing);Decreased safety awareness;Decreased knowledge of use of DME or AE;Cardiopulmonary status limiting activity      OT Treatment/Interventions: Self-care/ADL training;Therapeutic exercise;Neuromuscular education;Energy conservation;DME and/or AE instruction;Therapeutic activities;Patient/family education;Balance training    OT Goals(Current goals can be found in the care plan section) Acute Rehab OT Goals Patient Stated Goal: to go home OT Goal Formulation: With patient Time For Goal Achievement: 12/20/19 Potential to Achieve Goals: Good  OT Frequency: Min 2X/week   Barriers to D/C:            Co-evaluation              AM-PAC OT "6 Clicks" Daily Activity     Outcome Measure Help from another person eating meals?: None Help from another person taking care of personal grooming?: A Little Help from another person toileting, which includes using toliet, bedpan, or urinal?: A Little Help from another person bathing (including  washing, rinsing, drying)?: A Little Help from another person to put on and taking off regular upper body clothing?: A Little Help from another person to put on and taking off regular lower body clothing?: A Little 6 Click Score: 19   End of Session Equipment Utilized During Treatment: Gait belt Nurse Communication: Mobility status  Activity Tolerance: Patient tolerated treatment well Patient left: in chair;with call bell/phone within reach  OT Visit Diagnosis: Unsteadiness on feet (R26.81);Muscle weakness (generalized) (M62.81)                Time: 3338-3291 OT Time Calculation (min): 30 min Charges:  OT General Charges $OT Visit: 1 Visit OT Evaluation $OT Eval Moderate Complexity: 1 Mod OT Treatments $Self Care/Home Management : 8-22 mins  Maurie Boettcher, OT/L   Acute OT Clinical Specialist [place] Pager [phone] Office [phone]   [facility] 12/06/2019, 4:04 PM

## 2019-12-06 NOTE — Progress Notes (Signed)
Noted Glucose trending down since midnight. This am CBG 76, lunch CBG 54. Pt was treated with juices, lunch on it's way to pt's room. Lantus 25 units q hs was d/c'd after midnight. Will continue to monitor his glucose level every 2 hrs and treat PRN per ordered.

## 2019-12-06 NOTE — Plan of Care (Signed)

## 2019-12-06 NOTE — Progress Notes (Signed)
PROGRESS NOTE                                                                                                                                                                                                             Patient Demographics:    Kevin Hawkins, is a 74 y.o. male, DOB - 1945-09-19, VHQ:[id number]  Outpatient Primary MD for the patient is Hamrick, Lorin Mercy, MD    LOS - 3  Admit date - 12/03/2019    Chief Complaint  Patient presents with  . Covid Positive       Brief Narrative (HPI from H&P) - 74 y.o. 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  Was diagnosed with COVID-19 pneumonia along with hypoxic respiratory failure and admitted to the hospital    Subjective:    Kevin Hawkins today denies any headache, chest pain or abdominal pain, no dyspnea, no cough, he did have mild hypoglycemia earlier today.    Assessment  & Plan :    Acute Hypoxic Resp. Failure due to Acute Covid 19 Viral Pneumonitis during the ongoing 2020 Covid 19 Pandemic  - he is unfortunately not vaccinated and immunocompromised due to prolonged steroid use at home, fortunately he is responding well to steroid and remdesivir treatment which will be continued.  Orland Mustard will be his last day of remdesivir, currently tolerating room air at rest, requiring oxygen only with ambulation .  Encouraged the patient to sit up in chair in the daytime use I-S and flutter valve for pulmonary toiletry and then prone in bed when at night.  Will advance activity and titrate down oxygen as possible.  SpO2: 98 % O2 Flow Rate (L/min): 3 L/min    Recent Labs  Lab  12/03/19 1415 12/03/19 1541 12/04/19 0749 12/05/19 0541 12/06/19 0130 12/06/19 0132  WBC  --  4.9 3.3* 4.9 5.8  --   HGB  --  10.0* 10.8* 10.9* 10.0*  --   HCT  --  33.2* 34.4* 33.9* 31.1*  --   PLT  --  160 156 192 211  --   CRP  --  12.9* 16.7* 11.7* 7.0*  --   BNP  --   --  86.0 86.8  --  52.2  DDIMER  --  1.53* 1.47* 1.22* 0.82*  --   PROCALCITON  --  1.04 0.76 0.61 0.41  --   AST  --  287* 192* 122* 84*  --   ALT  --  182* 161* 127* 94*  --   ALKPHOS  --  74 72 67 58  --   BILITOT  --  0.4 0.7 0.6 0.3  --   ALBUMIN  --  2.2* 2.2* 2.3* 2.0*  --   LATICACIDVEN  --  1.3  --   --   --   --   SARSCOV2NAA POSITIVE*  --   --   --   --   --     History of myasthenia gravis,  - supposed to be on long-term steroids however abruptly stopped taking steroids about a week ago.  He said his PCP make him stop the steroids as his sugars were running high, counseled not to abruptly stop steroids in the future.  For now on IV steroids and doing well, if blood pressure drop steroid dose can be increased, after discharge follow-up with his rheumatologist within a week.  Patient for Bacterial pneumonia upon admission.   -Short 4-day course of Rocephin and oral doxycycline.  Procalcitonin trending down.  No productive cough or leukocytosis.  Dyslipidemia on statin and fenofibrate.  GERD.  PPI.  CKD 3B.  Baseline creatinine 1.3.  Stable.  Hyperkalemia.  Treated with Kayexalate and Lokelma.  Repeat BMP tomorrow.  Mildly elevated D-dimer due to inflammation.  Leg ultrasound unremarkable, moderate dose Lovenox and monitor.  Clinical suspicion for PE extremely low.  Hypothyroidism.  On Synthroid with stable TSH.  DM type II.  -Patient with hypoglycemia earlier today, with all his medication has been stopped including glipizide, glargine, he is only on sliding scale currently .  Lab Results  Component Value Date   HGBA1C 11.3 (H) 12/04/2019   CBG (last 3)  Recent Labs    12/06/19 0731  12/06/19 1019 12/06/19 1219  GLUCAP 76 94 52*       Condition - Fair  Family Communication  : Wife Enid Derry [phone] 12/05/2019, grandson was updated 10/20 by contacting Shirley's number  Code Status :  Full  Consults  :  None  Procedures  :    Leg [place] - No DVT  PUD Prophylaxis : PPI  Disposition Plan  :    Status is: Inpatient  Remains inpatient appropriate because:IV treatments appropriate due to intensity of illness or inability to take PO   Dispo: The patient is from: Home              Anticipated d/c is to: Home              Anticipated d/c date is: 1 day              Patient currently is not medically stable to d/c. After finishing IV steroids and Remdesivir   DVT Prophylaxis  :  Lovenox    Lab Results  Component Value Date   PLT 211 12/06/2019    Diet :  Diet Order            Diet Carb Modified Fluid consistency: Thin; Room service appropriate? Yes  Diet effective now                  Inpatient Medications  Scheduled Meds: . aspirin  325 mg Oral Daily  . atorvastatin  40 mg Oral Daily  . cabozantinib  20 mg Oral QHS  . doxycycline  100 mg Oral Q12H  . enoxaparin (LOVENOX) injection  40 mg Subcutaneous Q24H  . fenofibrate  160 mg Oral Daily  . fludrocortisone  0.05 mg Oral Daily  . insulin aspart  0-15 Units Subcutaneous TID WC  . insulin aspart  0-5 Units Subcutaneous QHS  . levothyroxine  125 mcg Oral Daily  . methylPREDNISolone (SOLU-MEDROL) injection  40 mg Intravenous Daily  . pantoprazole  40 mg Oral Daily  . Ensure Max Protein  11 oz Oral BID  . vitamin B-12  50 mcg Oral Daily   Continuous Infusions: . sodium chloride    . calcium gluconate    . cefTRIAXone (ROCEPHIN)  IV Stopped (12/05/19 1930)  . remdesivir 100 mg in NS 100 mL 100 mg (12/06/19 0812)   PRN Meds:.sodium chloride, ALPRAZolam, chlorpheniramine-HYDROcodone, guaiFENesin-dextromethorphan, loperamide, nitroGLYCERIN, [DISCONTINUED] ondansetron **OR** ondansetron  (ZOFRAN) IV  Antibiotics  :    Anti-infectives (From admission, onward)   Start     Dose/Rate Route Frequency Ordered Stop   12/04/19 1915  cefTRIAXone (ROCEPHIN) 1 g in sodium chloride 0.9 % 100 mL IVPB  Status:  Discontinued        1 g 200 mL/hr over 30 Minutes Intravenous Every 24 hours 12/03/19 1951 12/04/19 1258   12/04/19 1915  cefTRIAXone (ROCEPHIN) 2 g in sodium chloride 0.9 % 100 mL IVPB        2 g 200 mL/hr over 30 Minutes Intravenous Every 24 hours 12/04/19 1258 12/07/19 2359   12/04/19 1230  doxycycline (VIBRA-TABS) tablet 100 mg        100 mg Oral Every 12 hours 12/04/19 1139 12/09/19 0959   12/04/19 1000  remdesivir 100 mg in sodium chloride 0.9 % 100 mL IVPB       "Followed by" Linked Group Details   100 mg 200 mL/hr over 30 Minutes Intravenous Daily 12/03/19 1423 12/08/19 0959   12/03/19 2000  azithromycin (ZITHROMAX) 500 mg in sodium chloride 0.9 % 250 mL IVPB  Status:  Discontinued        500 mg 250 mL/hr over 60 Minutes Intravenous Every 24 hours 12/03/19 1951 12/03/19 1955   12/03/19 1915  cefTRIAXone (ROCEPHIN) 1 g in sodium chloride 0.9 % 100 mL IVPB        1 g 200 mL/hr over 30 Minutes Intravenous  Once 12/03/19 1909 12/03/19 1955   12/03/19 1915  azithromycin (ZITHROMAX) 500 mg in sodium chloride 0.9 % 250 mL IVPB  Status:  Discontinued        500 mg 250 mL/hr over 60 Minutes Intravenous  Once 12/03/19 1909 12/03/19 1949   12/03/19 1600  remdesivir 200 mg in sodium chloride 0.9% 250 mL IVPB       "Followed by" Linked Group Details   200 mg 580 mL/hr over 30 Minutes Intravenous Once 12/03/19 1423 12/03/19 1630        Bertrand Vowels M.D on 12/06/2019 at 1:55 PM  To page go to [email or web address]  [facility] -  Office  [phone]   See all Orders from today for further details    Objective:   Vitals:   12/05/19 1454 12/05/19 2106 12/05/19 2150 12/06/19 0526  BP: 135/76 131/82  122/78  Pulse: 87 72  79  Resp: 20 (!) 21  (!) 21  Temp: 97.7  F (36.5 C) 97.6 F (  36.4 C)  97.9 F (36.6 C)  TempSrc: Oral Oral  Oral  SpO2: 97% 96% 98% 98%  Weight:      Height:        Wt Readings from Last 3 Encounters:  12/03/19 79.4 kg  11/03/15 75.6 kg  07/20/14 75.4 kg     Intake/Output Summary (Last 24 hours) at 12/06/2019 1355 Last data filed at 12/06/2019 0900 Gross per 24 hour  Intake 597 ml  Output --  Net 597 ml     Physical Exam  Awake Alert, Oriented X 3, No new F.N deficits, Normal affect Symmetrical Chest wall movement, Good air movement bilaterally, Scattered rales RRR,No Gallops,Rubs or new Murmurs, No Parasternal Heave +ve B.Sounds, Abd Soft, No tenderness, No rebound - guarding or rigidity. No Cyanosis, Clubbing or edema, No new Rash or bruise        Data Review:    CBC Recent Labs  Lab 12/03/19 1541 12/04/19 0749 12/05/19 0541 12/06/19 0130  WBC 4.9 3.3* 4.9 5.8  HGB 10.0* 10.8* 10.9* 10.0*  HCT 33.2* 34.4* 33.9* 31.1*  PLT 160 156 192 211  MCV 90.5 86.4 85.0 86.6  MCH 27.2 27.1 27.3 27.9  MCHC 30.1 31.4 32.2 32.2  RDW 19.4* 19.2* 19.2* 19.3*  LYMPHSABS 1.3 0.6* 0.9 1.2  MONOABS 0.3 0.1 0.3 0.4  EOSABS 0.0 0.0 0.0 0.0  BASOSABS 0.0 0.0 0.0 0.0    Recent Labs  Lab 12/03/19 1541 12/04/19 0749 12/04/19 0835 12/05/19 0541 12/06/19 0130 12/06/19 0132  NA 133* 134*  --  138 138  --   K 4.3 5.4*  --  3.7 3.8  --   CL 99 101  --  103 102  --   CO2 23 22  --  21* 21*  --   GLUCOSE 161* 307*  --  92 146*  --   BUN 16 24*  --  31* 41*  --   CREATININE 1.35* 1.28*  --  1.47* 1.57*  --   CALCIUM 6.9* 7.2*  --  7.1* 6.9*  --   AST 287* 192*  --  122* 84*  --   ALT 182* 161*  --  127* 94*  --   ALKPHOS 74 72  --  67 58  --   BILITOT 0.4 0.7  --  0.6 0.3  --   ALBUMIN 2.2* 2.2*  --  2.3* 2.0*  --   MG  --  2.1  --  2.0 1.7  --   CRP 12.9* 16.7*  --  11.7* 7.0*  --   DDIMER 1.53* 1.47*  --  1.22* 0.82*  --   PROCALCITON 1.04 0.76  --  0.61 0.41  --   LATICACIDVEN 1.3  --   --   --   --    --   TSH  --   --  1.603  --   --   --   HGBA1C  --  11.3*  --   --   --   --   BNP  --  86.0  --  86.8  --  52.2    ------------------------------------------------------------------------------------------------------------------ Recent Labs    12/03/19 1541  TRIG 96    Lab Results  Component Value Date   HGBA1C 11.3 (H) 12/04/2019   ------------------------------------------------------------------------------------------------------------------ Recent Labs    12/04/19 0835  TSH 1.603    Cardiac Enzymes No results for input(s): CKMB, TROPONINI, MYOGLOBIN in the last 168 hours.  Invalid input(s): CK ------------------------------------------------------------------------------------------------------------------  Component Value Date/Time   BNP 52.2 12/06/2019 0132    Micro Results Recent Results (from the past 240 hour(s))  Respiratory Panel by RT PCR (Flu A&B, Covid) - Nasopharyngeal Swab     Status: Abnormal   Collection Time: 12/03/19  2:15 PM   Specimen: Nasopharyngeal Swab  Result Value Ref Range Status   SARS Coronavirus 2 by RT PCR POSITIVE (A) NEGATIVE Final    Comment: RESULT CALLED TO, READ BACK BY AND VERIFIED WITH: RN A DAVIDSON [id number] AT 6734 BY CM (NOTE) SARS-CoV-2 target nucleic acids are DETECTED.  SARS-CoV-2 RNA is generally detectable in upper respiratory specimens  during the acute phase of infection. Positive results are indicative of the presence of the identified virus, but do not rule out bacterial infection or co-infection with other pathogens not detected by the test. Clinical correlation with patient history and other diagnostic information is necessary to determine patient infection status. The expected result is Negative.  Fact Sheet for Patients:  PinkCheek.be  Fact Sheet for Healthcare Providers: GravelBags.it  This test is not yet approved or cleared by the [place] FDA and  has been authorized for detection and/or diagnosis of SARS-CoV-2 by FDA under an Emergency Use Authorization (EUA).  This EUA will remain in effect (meaning this test can be  used) for the duration of  the COVID-19 declaration under Section 564(b)(1) of the Act, 21 U.S.C. section 360bbb-3(b)(1), unless the authorization is terminated or revoked sooner.      Influenza A by PCR NEGATIVE NEGATIVE Final   Influenza B by PCR NEGATIVE NEGATIVE Final    Comment: (NOTE) The Xpert Xpress SARS-CoV-2/FLU/RSV assay is intended as an aid in  the diagnosis of influenza from Nasopharyngeal swab specimens and  should not be used as a sole basis for treatment. Nasal washings and  aspirates are unacceptable for Xpert Xpress SARS-CoV-2/FLU/RSV  testing.  Fact Sheet for Patients: PinkCheek.be  Fact Sheet for Healthcare Providers: GravelBags.it  This test is not yet approved or cleared by the [place] FDA and  has been authorized for detection and/or diagnosis of SARS-CoV-2 by  FDA under an Emergency Use Authorization (EUA). This EUA will remain  in effect (meaning this test can be used) for the duration of the  Covid-19 declaration under Section 564(b)(1) of the Act, 21  U.S.C. section 360bbb-3(b)(1), unless the authorization is  terminated or revoked. Performed at [facility], [place] [address] [place], [place] [address]   Blood Culture (routine x 2)     Status: None (Preliminary result)   Collection Time: 12/03/19  2:20 PM   Specimen: BLOOD  Result Value Ref Range Status   Specimen Description BLOOD LEFT ANTECUBITAL  Final   Special Requests   Final    BOTTLES DRAWN AEROBIC AND ANAEROBIC Blood Culture adequate volume   Culture   Final    NO GROWTH 3 DAYS Performed at [facility], [place] [address] [place], [place] [address]    Report Status PENDING  Incomplete  Blood Culture (routine x 2)     Status:  None (Preliminary result)   Collection Time: 12/03/19  2:43 PM   Specimen: BLOOD RIGHT HAND  Result Value Ref Range Status   Specimen Description BLOOD RIGHT HAND  Final   Special Requests   Final    BOTTLES DRAWN AEROBIC AND ANAEROBIC Blood Culture results may not be optimal due to an inadequate volume of blood received in culture bottles   Culture  Final    NO GROWTH 3 DAYS Performed at [facility], [place] [address], [place], [place] [address]    Report Status PENDING  Incomplete    Radiology Reports DG Chest Port 1 View  Result Date: 12/03/2019 CLINICAL DATA:  Hypoxia.  Reported COVID-19 positive EXAM: PORTABLE CHEST 1 VIEW COMPARISON:  December 21, 2018 FINDINGS: There is scarring in the left base with questionable small left effusion. There is also mild scarring in the right base. There is ill-defined airspace opacity in the left lower lung region. Heart is upper normal in size with pulmonary vascularity normal. No adenopathy. No bone lesions. IMPRESSION: Ill-defined airspace opacity left lower lobe consistent with pneumonia. Suspect atypical organism pneumonia given this appearance. Scarring in the lung bases with questionable scarring versus small pleural effusion lateral left base. Stable cardiac silhouette. Electronically Signed   By: Lowella Grip III M.D.   On: 12/03/2019 15:20   VAS [place] LOWER EXTREMITY VENOUS (DVT)  Result Date: 12/04/2019  Lower Venous DVTStudy Other Indications: Rapid Rise in D Dimer. Risk Factors: + Covid. Performing Technologist: Griffin Basil RCT RDMS  Examination Guidelines: A complete evaluation includes B-mode imaging, spectral Doppler, color Doppler, and power Doppler as needed of all accessible portions of each vessel. Bilateral testing is considered an integral part of a complete examination. Limited examinations for reoccurring indications may be performed as noted. The reflux portion of the exam is performed with the patient in reverse  Trendelenburg.  +---------+---------------+---------+-----------+----------+--------------+ RIGHT    CompressibilityPhasicitySpontaneityPropertiesThrombus Aging +---------+---------------+---------+-----------+----------+--------------+ CFV      Full           Yes      Yes                                 +---------+---------------+---------+-----------+----------+--------------+ SFJ      Full                                                        +---------+---------------+---------+-----------+----------+--------------+ FV Prox  Full                                                        +---------+---------------+---------+-----------+----------+--------------+ FV Mid   Full                                                        +---------+---------------+---------+-----------+----------+--------------+ FV DistalFull                                                        +---------+---------------+---------+-----------+----------+--------------+ PFV      Full                                                        +---------+---------------+---------+-----------+----------+--------------+  POP      Full           Yes      Yes                                 +---------+---------------+---------+-----------+----------+--------------+ PTV      Full                                                        +---------+---------------+---------+-----------+----------+--------------+ PERO     Full                                                        +---------+---------------+---------+-----------+----------+--------------+   +---------+---------------+---------+-----------+----------+--------------+ LEFT     CompressibilityPhasicitySpontaneityPropertiesThrombus Aging +---------+---------------+---------+-----------+----------+--------------+ CFV      Full           Yes      Yes                                  +---------+---------------+---------+-----------+----------+--------------+ SFJ      Full                                                        +---------+---------------+---------+-----------+----------+--------------+ FV Prox  Full                                                        +---------+---------------+---------+-----------+----------+--------------+ FV Mid   Full                                                        +---------+---------------+---------+-----------+----------+--------------+ FV DistalFull                                                        +---------+---------------+---------+-----------+----------+--------------+ PFV      Full                                                        +---------+---------------+---------+-----------+----------+--------------+ POP      Full           Yes      Yes                                 +---------+---------------+---------+-----------+----------+--------------+  PTV      Full                                                        +---------+---------------+---------+-----------+----------+--------------+ PERO     Full                                                        +---------+---------------+---------+-----------+----------+--------------+     Summary: RIGHT: - There is no evidence of deep vein thrombosis in the lower extremity.  - No cystic structure found in the popliteal fossa.  LEFT: - There is no evidence of deep vein thrombosis in the lower extremity.  - No cystic structure found in the popliteal fossa.  *See table(s) above for measurements and observations. Electronically signed by Ruta Hinds MD on 12/04/2019 at 5:41:13 PM.    Final

## 2019-12-06 NOTE — Evaluation (Signed)
Physical Therapy Evaluation Patient Details Name: Kevin Hawkins MRN: [id number] DOB: 09/20/1945 Today's Date: 12/06/2019   History of Present Illness  Pt is a 74 y.o. male admitted 12/03/19 with 10-day h/o dever, chills, cough, SOB. Workup for hypoxic respiratory failure secondary to COVID-19 viral pneumonitis. PMH includes HTN, RCC s/p nephrectomy, myasthenia gravis from immunotherapy, DM2; pt unvaccinated.    Clinical Impression  Pt presents with an overall decrease in functional mobility secondary to above. PTA, pt mod indep with intermittent use of SPC, lives with supportive family, enjoys mowing yard and staying active. Initiated education re: current condition, O2 needs, activity recommendations, therex, energy conservation, and importance of mobility. Today, pt able to perform multiple bouts of seated and standing activity, including hallway ambulation. SpO2 >/92% when pleth reliable; no significant SOB or DOE noted. Pt hopeful for d/c home tomorrow. Discussed recommendation for OP PT services due to c/o chronic weakness and fall at home; pt willing to consider. Will follow acutely to address established goals.    Follow Up Recommendations Outpatient PT;Supervision - Intermittent    Equipment Recommendations  None recommended by PT    Recommendations for Other Services       Precautions / Restrictions Precautions Precautions: Fall Restrictions Weight Bearing Restrictions: No      Mobility  Bed Mobility Overal bed mobility: Independent             General bed mobility comments: Received sitting in recliner    Transfers Overall transfer level: Independent Equipment used: None Transfers: Sit to/from Stand Sit to Stand: Supervision Stand pivot transfers: Supervision       General transfer comment: Multiple sit<>stands from recliner, independent, reliant on UE support to push into standing  Ambulation/Gait Ambulation/Gait assistance: Supervision Gait  Distance (Feet): 300 Feet Assistive device: None Gait Pattern/deviations: Step-through pattern;Decreased stride length Gait velocity: Decreased   General Gait Details: Slow, steady gait with supervision for safety and SpO2 monitoring; pt reports "weak hip muscles"; no overt instabiltiy or LOB. SpO2 >/92% on RA when reliable pleth; no significant DOE or SOB noted  Stairs            Wheelchair Mobility    Modified Rankin (Stroke Patients Only)       Balance Overall balance assessment: Needs assistance   Sitting balance-Leahy Scale: Good       Standing balance-Leahy Scale: Good                               Pertinent Vitals/Pain Pain Assessment: No/denies pain    Home Living Family/patient expects to be discharged to:: Private residence Living Arrangements: Spouse/significant other Available Help at Discharge: Family;Available 24 hours/day Type of Home: Mobile home Home Access: Ramped entrance     Home Layout: One level Home Equipment: Colon - 2 wheels;Cane - single point;Bedside commode;Grab bars - tub/shower      Prior Function Level of Independence: Independent with assistive device(s)   Gait / Transfers Assistance Needed: Household ambulation without DME; typically uses SPC outside and can walk "a pretty good ways." Wife drives (pt will drive short, familiar distances)  ADL's / Homemaking Assistance Needed: Wife oocasionally helps with dressing due to chronic bilateral shoulder deficits; wife helps with medication management  Comments: Had fall a few months prior and was not able to get up due to LE weakness; reports, "I never got my strength back since getting sick" (in reference to myasthenia gravis)  Hand Dominance   Dominant Hand: Right    Extremity/Trunk Assessment   Upper Extremity Assessment Upper Extremity Assessment: Defer to OT evaluation;Generalized weakness;RUE deficits/detail;LUE deficits/detail RUE Deficits / Details: RTC  impairment; ; @ 30 degrees abd; essentailly no FF; Has had difficulty for over a year RUE Sensation:  (tinglly on ends when "sugar jumps up") RUE Coordination: decreased gross motor LUE Deficits / Details: RTC impairment; ; @ 30 degrees abd; essentailly no FF; Has had difficulty for over a year    Lower Extremity Assessment Lower Extremity Assessment: Generalized weakness    Cervical / Trunk Assessment Cervical / Trunk Assessment: Normal;Other exceptions (forward head)  Communication   Communication: HOH  Cognition Arousal/Alertness: Awake/alert Behavior During Therapy: WFL for tasks assessed/performed Overall Cognitive Status: Within Functional Limits for tasks assessed                                        General Comments General comments (skin integrity, edema, etc.): Enjoys  mowing his yard; 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    Exercises Other Exercises Other Exercises: Incentive spirometer x5 (cues for technique) pulling ~1250 mL; FV x8 Other Exercises: 5x sit<>stand, LAQ, seated marching   Assessment/Plan    PT Assessment Patient needs continued PT services  PT Problem List Decreased strength;Decreased activity tolerance;Decreased mobility;Cardiopulmonary status limiting activity       PT Treatment Interventions Gait training;Functional mobility training;Therapeutic activities;Therapeutic exercise;Patient/family education    PT Goals (Current goals can be found in the Care Plan section)  Acute Rehab PT Goals Patient Stated Goal: to go home; will "think about" outpatient PT PT Goal Formulation: With patient Time For Goal Achievement: 12/20/19 Potential to Achieve Goals: Good    Frequency Min 3X/week   Barriers to discharge        Co-evaluation               AM-PAC PT "6 Clicks" Mobility  Outcome Measure Help needed turning from your back to your side while in a flat bed without using bedrails?: None Help needed moving  from lying on your back to sitting on the side of a flat bed without using bedrails?: None Help needed moving to and from a bed to a chair (including a wheelchair)?: None Help needed standing up from a chair using your arms (e.g., wheelchair or bedside chair)?: None Help needed to walk in hospital room?: None Help needed climbing 3-5 steps with a railing? : A Little 6 Click Score: 23    End of Session Equipment Utilized During Treatment: Gait belt Activity Tolerance: Patient tolerated treatment well Patient left: in chair;with call bell/phone within reach Nurse Communication: Mobility status PT Visit Diagnosis: Other abnormalities of gait and mobility (R26.89)    Time: 0277-4128 PT Time Calculation (min) (ACUTE ONLY): 21 min   Charges:   PT Evaluation $PT Eval Moderate Complexity: [place], PT, DPT Acute Rehabilitation Services  Pager [phone] Office [place] 12/06/2019, 4:55 PM

## 2019-12-07 DIAGNOSIS — J189 Pneumonia, unspecified organism: Secondary | ICD-10-CM

## 2019-12-07 DIAGNOSIS — E273 Drug-induced adrenocortical insufficiency: Secondary | ICD-10-CM

## 2019-12-07 LAB — CBC WITH DIFFERENTIAL/PLATELET
Abs Immature Granulocytes: 0.02 10*3/uL (ref 0.00–0.07)
Basophils Absolute: 0 10*3/uL (ref 0.0–0.1)
Basophils Relative: 0 %
Eosinophils Absolute: 0 10*3/uL (ref 0.0–0.5)
Eosinophils Relative: 0 %
HCT: 31.7 % — ABNORMAL LOW (ref 39.0–52.0)
Hemoglobin: 10.1 g/dL — ABNORMAL LOW (ref 13.0–17.0)
Immature Granulocytes: 1 %
Lymphocytes Relative: 28 %
Lymphs Abs: 1 10*3/uL (ref 0.7–4.0)
MCH: 27.7 pg (ref 26.0–34.0)
MCHC: 31.9 g/dL (ref 30.0–36.0)
MCV: 87.1 fL (ref 80.0–100.0)
Monocytes Absolute: 0.3 10*3/uL (ref 0.1–1.0)
Monocytes Relative: 8 %
Neutro Abs: 2.3 10*3/uL (ref 1.7–7.7)
Neutrophils Relative %: 63 %
Platelets: 207 10*3/uL (ref 150–400)
RBC: 3.64 MIL/uL — ABNORMAL LOW (ref 4.22–5.81)
RDW: 19.3 % — ABNORMAL HIGH (ref 11.5–15.5)
WBC: 3.6 10*3/uL — ABNORMAL LOW (ref 4.0–10.5)
nRBC: 0 % (ref 0.0–0.2)

## 2019-12-07 LAB — PROCALCITONIN: Procalcitonin: 0.1 ng/mL

## 2019-12-07 LAB — COMPREHENSIVE METABOLIC PANEL
ALT: 79 U/L — ABNORMAL HIGH (ref 0–44)
AST: 68 U/L — ABNORMAL HIGH (ref 15–41)
Albumin: 2 g/dL — ABNORMAL LOW (ref 3.5–5.0)
Alkaline Phosphatase: 62 U/L (ref 38–126)
Anion gap: 11 (ref 5–15)
BUN: 31 mg/dL — ABNORMAL HIGH (ref 8–23)
CO2: 25 mmol/L (ref 22–32)
Calcium: 6.9 mg/dL — ABNORMAL LOW (ref 8.9–10.3)
Chloride: 103 mmol/L (ref 98–111)
Creatinine, Ser: 1.29 mg/dL — ABNORMAL HIGH (ref 0.61–1.24)
GFR, Estimated: 55 mL/min — ABNORMAL LOW (ref >60)
Glucose, Bld: 182 mg/dL — ABNORMAL HIGH (ref 70–99)
Potassium: 3.7 mmol/L (ref 3.5–5.1)
Sodium: 139 mmol/L (ref 135–145)
Total Bilirubin: 0.5 mg/dL (ref 0.3–1.2)
Total Protein: 5.2 g/dL — ABNORMAL LOW (ref 6.5–8.1)

## 2019-12-07 LAB — D-DIMER, QUANTITATIVE: D-Dimer, Quant: 1.02 ug/mL-FEU — ABNORMAL HIGH (ref 0.00–0.50)

## 2019-12-07 LAB — MAGNESIUM: Magnesium: 1.6 mg/dL — ABNORMAL LOW (ref 1.7–2.4)

## 2019-12-07 LAB — GLUCOSE, CAPILLARY
Glucose-Capillary: 59 mg/dL — ABNORMAL LOW (ref 70–99)
Glucose-Capillary: 103 mg/dL — ABNORMAL HIGH (ref 70–99)
Glucose-Capillary: 182 mg/dL — ABNORMAL HIGH (ref 70–99)
Glucose-Capillary: 143 mg/dL — ABNORMAL HIGH (ref 70–99)
Glucose-Capillary: 79 mg/dL (ref 70–99)
Glucose-Capillary: 172 mg/dL — ABNORMAL HIGH (ref 70–99)
Glucose-Capillary: 193 mg/dL — ABNORMAL HIGH (ref 70–99)

## 2019-12-07 LAB — C-REACTIVE PROTEIN: CRP: 4 mg/dL — ABNORMAL HIGH (ref <1.0)

## 2019-12-07 LAB — BRAIN NATRIURETIC PEPTIDE: B Natriuretic Peptide: 135.1 pg/mL — ABNORMAL HIGH (ref 0.0–100.0)

## 2019-12-07 MED ORDER — NOVOLIN R 100 UNIT/ML IJ SOLN
0.0000 [IU] | Freq: Three times a day (TID) | INTRAMUSCULAR | 11 refills | Status: AC | PRN
Start: 2019-12-07 — End: ?

## 2019-12-07 MED ORDER — MAGNESIUM SULFATE 2 GM/50ML IV SOLN
2.0000 g | Freq: Once | INTRAVENOUS | Status: AC
  Administered 2019-12-07: 2 g via INTRAVENOUS
  Filled 2019-12-07: qty 50

## 2019-12-07 NOTE — TOC Transition Note (Signed)
Transition of Care [facility]) - CM/SW Discharge Note   Patient Details  Name: Kevin Hawkins MRN: [id number] Date of Birth: [birth year]  [facility] CM/SW Contact:  Verdell Carmine, RN Phone Number: 12/07/2019, 12:39 PM   Clinical Narrative:    Patient going home with [facility] for pT services. Unable to speak to Patient, wife gave permission for services and [facility] as choice. No further needs identified, off oxygen.   Final next level of care: [place] Barriers to Discharge: No Barriers Identified   Patient Goals and CMS Choice        Discharge Placement             Home with home health PT          Discharge Plan and Services   Discharge Planning Services: CM Consult                      HH Arranged: PT [facility] Agency: [place] Date [place]: 12/07/19 Time [place]: [id number] Representative spoke with at [place]: Adela Lank  Social Determinants of Health ([place]) Interventions     Readmission Risk Interventions No flowsheet data found.

## 2019-12-07 NOTE — Discharge Summary (Signed)
Kevin Hawkins, is a 74 y.o. male  DOB 04/23/1945  MRN [id number].  Admission date:  12/03/2019  Admitting Physician  Etta Quill, DO  Discharge Date:  12/07/2019   Primary MD  Hamrick, Lorin Mercy, MD  Recommendations for primary care physician for things to follow:  -Blood pressure acceptable during hospital stay, antihypertensive occasion has been stopped, please resume as an outpatient if indicated.  Admission Diagnosis  Hypoxia [R09.02] Acute hypoxemic respiratory failure due to COVID-19 ([place]) [U07.1, J96.01] COVID-19 [U07.1]   Discharge Diagnosis  Hypoxia [R09.02] Acute hypoxemic respiratory failure due to COVID-19 ([place]) [U07.1, J96.01] COVID-19 [U07.1]    Principal Problem:   Acute hypoxemic respiratory failure due to COVID-19 [facility]) Active Problems:   Type 2 diabetes mellitus without complication, without long-term current use of insulin (HCC)   Essential hypertension   Chronic kidney disease, stage 2, mildly decreased GFR   Adrenal insufficiency due to cancer therapy ([place])   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 ED (erectile dysfunction)   . GERD (gastroesophageal reflux disease)   . Hyperlipidemia, mixed   . Hypertension   . Myasthenia gravis ([place])   . Renal cancer ([place])    S/P left nephrectomy 2010, reocurred 2017, on maint chemo  . Skin cancer    under right eye"  . Type II diabetes mellitus ([place])     Past Surgical History:  Procedure Laterality Date  . APPENDECTOMY    . CHOLECYSTECTOMY    . NEPHRECTOMY Left ~ 2010  . PANCREAS SURGERY     "I've got 2/3 of it left"  . SKIN CANCER EXCISION Right    "under eye"  . THYROIDECTOMY         History of present illness and  Hospital Course:     Kindly see H&P  for history of present illness and admission details, please review complete Labs, Consult reports and Test reports for all details in brief  HPI  from the history and physical done on the day of admission 12/03/2019  HPI: Kevin Hawkins is a 74 y.o. male with medical history significant of HTN, RCC s/p nephrectomy, reoccurrence in 2017 now on suppressive immunotherapy, myasthenia gravis from immunotherapy, chronic steroid use, DM2.  Pt unfortunately did not get the COVID vaccine.  Pt presents to the ED with cough, fever, chills, SOB.  Symptoms onset 10/8.  Tested positive for COVID 10/14.  Symptoms progressively worsened with worsening SOB, worse with exertion.  Non-productive cough.  No CP.  No vomiting nor diarrhea.  Has nausea.   ED Course: COVID positive, WBC 4.9k, creat 1.35 (around baseline), AST 287, ALT 182.  Procalcitonin 1.04.  CXR shows RLL PNA, atypical appearance.  EDP gave solumedrol, remdesivir, also covered for CAP with rocephin.  Satting 87% on RA, improved on 2L.   Hospital Course  Acute Hypoxic Resp. Failure due to Acute Covid 19 Viral Pneumonitis during the ongoing 2020 Covid 19 Pandemic  - he is unfortunately not vaccinated and immunocompromised due to prolonged steroid use at home, fortunately he is responding well to steroid and remdesivir treatment oxygen , he is currently with no oxygen requirement, he finished his remdesivir treatment, he is to continue his home dose prednisone on discharge.  . -He was encouraged to take his incentive spirometer and flutter valve and keep using it at home .  Adrenal insufficiency -This has been managed by endocrinology as an outpatient, I have discussed with wife, his methylprednisolone p.o. was recently changed to prednisone 20 mg oral daily, so I have instructed her to have him resume his prednisone at home, and to continue with Florinef.  Suspicion for bacterial pneumonia upon admission.   -Treated with  Rocephin and doxycycline  Dyslipidemia on statin and fenofibrate.  GERD.  PPI.  CKD 3B.  Baseline creatinine 1.3.  Stable.  Hyperkalemia.  Treated with Kayexalate and Lokelma.    Mildly elevated D-dimer due to inflammation.  Leg ultrasound unremarkable, Clinical suspicion for PE extremely low.  He was treated with low-dose Lovenox during hospital stay.  Continue aspirin discharge.  Hypothyroidism.  On Synthroid with stable TSH.  DM type II.  -Patient with episodes of hypoglycemia during hospital stay, have discussed with the wife, she confirms that his Metformin and glipizide has been stopped by his physician, and he is not taking it no more, he is only on Novolin R sliding scale .  Hypertension -Blood pressure acceptable during hospital stay of his regimen, medication has been stopped on discharge, can be resumed  outpatient once appropriate.  Discharge Condition:  stable     Discharge Instructions  and  Discharge Medications    Discharge Instructions    Diet - low sodium heart healthy   Complete by: As directed    Discharge instructions   Complete by: As directed    Follow with Primary MD Hamrick, Maura L, MD in 10 days   Get CBC, CMP, 2 view Chest X ray checked  by Primary MD next visit.    Activity: As tolerated with Full fall precautions use walker/cane & assistance as needed   Disposition Home    Diet: Heart Healthy /Carb modified, with feeding assistance and aspiration precautions.  For Heart failure patients - Check your Weight same time everyday, if you gain over 2 pounds, or you develop in leg swelling, experience more shortness of breath or chest pain, call your Primary MD immediately. Follow Cardiac Low Salt Diet and 1.5 lit/day fluid restriction.   On your next visit with your primary care physician please Get Medicines reviewed and adjusted.   Please request your Prim.MD to go over all Hospital Tests and Procedure/Radiological results at the  follow up, please get all Hospital records sent to your Prim MD by signing hospital release before you go home.   If you experience worsening of your admission symptoms, develop shortness of breath, life threatening emergency, suicidal or homicidal thoughts you must seek medical attention immediately by calling 911 or calling your MD immediately  if symptoms less severe.  You Must read complete instructions/literature along with all the possible adverse reactions/side effects for all the Medicines you take and that have been prescribed to you. Take any new Medicines after you have completely understood and accpet all the possible adverse reactions/side effects.   Do not drive, operating heavy machinery, perform activities at heights,  swimming or participation in water activities or provide baby sitting services if your were admitted for syncope or siezures until you have seen by Primary MD or a Neurologist and advised to do so again.  Do not drive when taking Pain medications.    Do not take more than prescribed Pain, Sleep and Anxiety Medications  Special Instructions: If you have smoked or chewed Tobacco  in the last 2 yrs please stop smoking, stop any regular Alcohol  and or any Recreational drug use.  Wear Seat belts while driving.   Please note  You were cared for by a hospitalist during your hospital stay. If you have any questions about your discharge medications or the care you received while you were in the hospital after you are discharged, you can call the unit and asked to speak with the hospitalist on call if the hospitalist that took care of you is not available. Once you are discharged, your primary care physician will handle any further medical issues. Please note that NO REFILLS for any discharge medications will be authorized once you are discharged, as it is imperative that you return to your primary care physician (or establish a relationship with a primary care physician if  you do not have one) for your aftercare needs so that they can reassess your need for medications and monitor your lab values.   Increase activity slowly   Complete by: As directed      Allergies as of 12/07/2019      Reactions   Nivolumab    Other reaction(s): Other (See Comments) Myasthenia Gravis      Medication List    STOP taking these medications   amLODipine 10 MG tablet Commonly known as: NORVASC   dexamethasone 1 MG tablet Commonly known as: DECADRON   glipiZIDE 10 MG tablet Commonly known as: GLUCOTROL   losartan 100 MG tablet Commonly known as: COZAAR   metFORMIN 500 MG 24 hr tablet Commonly known as: GLUCOPHAGE-XR     TAKE these medications   ALPRAZolam 0.25 MG tablet Commonly known as: XANAX Take 0.25 mg by mouth 2 (two) times daily as needed.   aspirin 325 MG tablet Take 325 mg by mouth daily.   atorvastatin 40 MG tablet Commonly known as: Lipitor Take 1 tablet (40 mg total) by mouth daily.   benzonatate 100 MG capsule Commonly known as: TESSALON Take 100 mg by mouth 3 (three) times daily as needed for cough.   Cabometyx 20 MG tablet Generic drug: cabozantinib Take 20 mg by mouth at bedtime.   fenofibrate 160 MG tablet Take 1 tablet (160 mg total) by mouth daily.   fludrocortisone 0.1 MG tablet Commonly known as: FLORINEF Take 0.05 mg by mouth daily.   furosemide 20 MG tablet Commonly known as: LASIX Take 20 mg by mouth daily.   levothyroxine 125 MCG tablet Commonly known as: SYNTHROID Take 125 mcg by mouth daily.   magnesium oxide 400 (241.3 Mg) MG tablet Commonly known as: MAG-OX Take 800 mg by mouth daily.   nitroGLYCERIN 0.4 MG SL tablet Commonly known as: Nitrostat Place 1 tablet (0.4 mg total) under the tongue every 5 (five) minutes as needed for chest pain.   NovoLIN R 100 units/mL injection Generic drug: insulin regular Inject 0-0.3 mLs (0-30 Units total) into the skin 3 (three) times daily as needed for high blood  sugar.   pantoprazole 40 MG tablet Commonly known as: PROTONIX Take 40 mg by mouth daily.   potassium chloride SA  20 MEQ tablet Commonly known as: KLOR-CON Take 20 mEq by mouth daily.   predniSONE 20 MG tablet Commonly known as: DELTASONE Take 20 mg by mouth daily.   vitamin B-12 100 MCG tablet Commonly known as: CYANOCOBALAMIN Take 50 mcg by mouth daily.         Diet and Activity recommendation: See Discharge Instructions above   Consults obtained -  None   Major procedures and Radiology Reports - PLEASE review detailed and final reports for all details, in brief -      DG Chest Port 1 View  Result Date: 12/03/2019 CLINICAL DATA:  Hypoxia.  Reported COVID-19 positive EXAM: PORTABLE CHEST 1 VIEW COMPARISON:  December 21, 2018 FINDINGS: There is scarring in the left base with questionable small left effusion. There is also mild scarring in the right base. There is ill-defined airspace opacity in the left lower lung region. Heart is upper normal in size with pulmonary vascularity normal. No adenopathy. No bone lesions. IMPRESSION: Ill-defined airspace opacity left lower lobe consistent with pneumonia. Suspect atypical organism pneumonia given this appearance. Scarring in the lung bases with questionable scarring versus small pleural effusion lateral left base. Stable cardiac silhouette. Electronically Signed   By: Lowella Grip III M.D.   On: 12/03/2019 15:20   VAS [place] LOWER EXTREMITY VENOUS (DVT)  Result Date: 12/04/2019  Lower Venous DVTStudy Other Indications: Rapid Rise in D Dimer. Risk Factors: + Covid. Performing Technologist: Griffin Basil RCT RDMS  Examination Guidelines: A complete evaluation includes B-mode imaging, spectral Doppler, color Doppler, and power Doppler as needed of all accessible portions of each vessel. Bilateral testing is considered an integral part of a complete examination. Limited examinations for reoccurring indications may be performed as  noted. The reflux portion of the exam is performed with the patient in reverse Trendelenburg.  +---------+---------------+---------+-----------+----------+--------------+ RIGHT    CompressibilityPhasicitySpontaneityPropertiesThrombus Aging +---------+---------------+---------+-----------+----------+--------------+ CFV      Full           Yes      Yes                                 +---------+---------------+---------+-----------+----------+--------------+ SFJ      Full                                                        +---------+---------------+---------+-----------+----------+--------------+ FV Prox  Full                                                        +---------+---------------+---------+-----------+----------+--------------+ FV Mid   Full                                                        +---------+---------------+---------+-----------+----------+--------------+ FV DistalFull                                                        +---------+---------------+---------+-----------+----------+--------------+  PFV      Full                                                        +---------+---------------+---------+-----------+----------+--------------+ POP      Full           Yes      Yes                                 +---------+---------------+---------+-----------+----------+--------------+ PTV      Full                                                        +---------+---------------+---------+-----------+----------+--------------+ PERO     Full                                                        +---------+---------------+---------+-----------+----------+--------------+   +---------+---------------+---------+-----------+----------+--------------+ LEFT     CompressibilityPhasicitySpontaneityPropertiesThrombus Aging +---------+---------------+---------+-----------+----------+--------------+ CFV      Full            Yes      Yes                                 +---------+---------------+---------+-----------+----------+--------------+ SFJ      Full                                                        +---------+---------------+---------+-----------+----------+--------------+ FV Prox  Full                                                        +---------+---------------+---------+-----------+----------+--------------+ FV Mid   Full                                                        +---------+---------------+---------+-----------+----------+--------------+ FV DistalFull                                                        +---------+---------------+---------+-----------+----------+--------------+ PFV      Full                                                        +---------+---------------+---------+-----------+----------+--------------+  POP      Full           Yes      Yes                                 +---------+---------------+---------+-----------+----------+--------------+ PTV      Full                                                        +---------+---------------+---------+-----------+----------+--------------+ PERO     Full                                                        +---------+---------------+---------+-----------+----------+--------------+     Summary: RIGHT: - There is no evidence of deep vein thrombosis in the lower extremity.  - No cystic structure found in the popliteal fossa.  LEFT: - There is no evidence of deep vein thrombosis in the lower extremity.  - No cystic structure found in the popliteal fossa.  *See table(s) above for measurements and observations. Electronically signed by Ruta Hinds MD on 12/04/2019 at 5:41:13 PM.    Final     Micro Results    Recent Results (from the past 240 hour(s))  Respiratory Panel by RT PCR (Flu A&B, Covid) - Nasopharyngeal Swab     Status: Abnormal   Collection Time: 12/03/19  2:15  PM   Specimen: Nasopharyngeal Swab  Result Value Ref Range Status   SARS Coronavirus 2 by RT PCR POSITIVE (A) NEGATIVE Final    Comment: RESULT CALLED TO, READ BACK BY AND VERIFIED WITH: RN A DAVIDSON [id number] AT 1884 BY CM (NOTE) SARS-CoV-2 target nucleic acids are DETECTED.  SARS-CoV-2 RNA is generally detectable in upper respiratory specimens  during the acute phase of infection. Positive results are indicative of the presence of the identified virus, but do not rule out bacterial infection or co-infection with other pathogens not detected by the test. Clinical correlation with patient history and other diagnostic information is necessary to determine patient infection status. The expected result is Negative.  Fact Sheet for Patients:  PinkCheek.be  Fact Sheet for Healthcare Providers: GravelBags.it  This test is not yet approved or cleared by the [place] FDA and  has been authorized for detection and/or diagnosis of SARS-CoV-2 by FDA under an Emergency Use Authorization (EUA).  This EUA will remain in effect (meaning this test can be  used) for the duration of  the COVID-19 declaration under Section 564(b)(1) of the Act, 21 U.S.C. section 360bbb-3(b)(1), unless the authorization is terminated or revoked sooner.      Influenza A by PCR NEGATIVE NEGATIVE Final   Influenza B by PCR NEGATIVE NEGATIVE Final    Comment: (NOTE) The Xpert Xpress SARS-CoV-2/FLU/RSV assay is intended as an aid in  the diagnosis of influenza from Nasopharyngeal swab specimens and  should not be used as a sole basis for treatment. Nasal washings and  aspirates are unacceptable for Xpert Xpress SARS-CoV-2/FLU/RSV  testing.  Fact Sheet for Patients: PinkCheek.be  Fact Sheet for Healthcare Providers: GravelBags.it  This test is not yet approved or cleared by  the [employer]  and  has been authorized for detection and/or diagnosis of SARS-CoV-2 by  FDA under an Emergency Use Authorization (EUA). This EUA will remain  in effect (meaning this test can be used) for the duration of the  Covid-19 declaration under Section 564(b)(1) of the Act, 21  U.S.C. section 360bbb-3(b)(1), unless the authorization is  terminated or revoked. Performed at [facility], [place] [address] [place], [place] [address]   Blood Culture (routine x 2)     Status: None (Preliminary result)   Collection Time: 12/03/19  2:20 PM   Specimen: BLOOD  Result Value Ref Range Status   Specimen Description BLOOD LEFT ANTECUBITAL  Final   Special Requests   Final    BOTTLES DRAWN AEROBIC AND ANAEROBIC Blood Culture adequate volume   Culture   Final    NO GROWTH 3 DAYS Performed at [facility], [place] [address] [place], [place] [address]    Report Status PENDING  Incomplete  Blood Culture (routine x 2)     Status: None (Preliminary result)   Collection Time: 12/03/19  2:43 PM   Specimen: BLOOD RIGHT HAND  Result Value Ref Range Status   Specimen Description BLOOD RIGHT HAND  Final   Special Requests   Final    BOTTLES DRAWN AEROBIC AND ANAEROBIC Blood Culture results may not be optimal due to an inadequate volume of blood received in culture bottles   Culture   Final    NO GROWTH 3 DAYS Performed at [facility], [place] [address], [place], [place] [address]    Report Status PENDING  Incomplete       Today   Subjective:   Amardeep Beckers today has no headache,no chest abdominal pain,no new weakness tingling or numbness, feels much better today.   Objective:   Blood pressure 136/72, pulse 63, temperature 97.9 F (36.6 C), temperature source Oral, resp. rate 20, height 5\' 6"  (1.676 m), weight 79.4 kg, SpO2 99 %.   Intake/Output Summary (Last 24 hours) at 12/07/2019 1053 Last data filed at 12/07/2019 0900 Gross per 24 hour  Intake 1280 ml  Output 400 ml  Net 880  ml    Exam Awake Alert, Oriented x 3, No new F.N deficits, Normal affect Symmetrical Chest wall movement, Good air movement bilaterally, CTAB RRR,No Gallops,Rubs or new Murmurs, No Parasternal Heave +ve B.Sounds, Abd Soft, Non tender,  No rebound -guarding or rigidity. No Cyanosis, Clubbing or edema, No new Rash or bruise  Data Review   CBC w Diff:  Lab Results  Component Value Date   WBC 3.6 (L) 12/07/2019   HGB 10.1 (L) 12/07/2019   HCT 31.7 (L) 12/07/2019   PLT 207 12/07/2019   LYMPHOPCT 28 12/07/2019   MONOPCT 8 12/07/2019   EOSPCT 0 12/07/2019   BASOPCT 0 12/07/2019    CMP:  Lab Results  Component Value Date   NA 139 12/07/2019   K 3.7 12/07/2019   CL 103 12/07/2019   CO2 25 12/07/2019   BUN 31 (H) 12/07/2019   CREATININE 1.29 (H) 12/07/2019   PROT 5.2 (L) 12/07/2019   ALBUMIN 2.0 (L) 12/07/2019   BILITOT 0.5 12/07/2019   ALKPHOS 62 12/07/2019   AST 68 (H) 12/07/2019   ALT 79 (H) 12/07/2019  .   Total Time in preparing paper work, data evaluation and todays exam - 58 minutes  Phillips Climes M.D on 12/07/2019 at 10:53 AM  [facility]   Office  [phone]     

## 2019-12-07 NOTE — TOC Initial Note (Signed)
Transition of Care [facility]) - Initial/Assessment Note    Patient Details  Name: Kevin Hawkins MRN: [id number] Date of Birth: [birth year]  Transition of Care [facility]) CM/SW Contact:    Verdell Carmine, RN Phone Number: 12/07/2019, 9:39 AM  Clinical Narrative:                 Admitted with COVID pneumonia , normally acitve. Recommended OP PT. Patient will get his own shower chair. as recommended by OT. Attempted to call patient in room, no answer at this time CM will continue to follow for needs. .    Expected Discharge Plan: Home/Self Care Barriers to Discharge: Continued Medical Work up   Patient Goals and CMS Choice        Expected Discharge Plan and Services Expected Discharge Plan: Home/Self Care   Discharge Planning Services: CM Consult   Living arrangements for the past 2 months: Single Family Home Expected Discharge Date: 12/07/19                         HH Arranged:  (OP PT)          Prior Living Arrangements/Services Living arrangements for the past 2 months: Single Family Home   Patient language and need for interpreter reviewed:: Yes        Need for Family Participation in Patient Care: Yes (Comment) Care giver support system in place?: Yes (comment)   Criminal Activity/Legal Involvement Pertinent to Current Situation/Hospitalization: No - Comment as needed  Activities of Daily Living Home Assistive Devices/Equipment: Cane (specify quad or straight) ADL Screening (condition at time of admission) Patient's cognitive ability adequate to safely complete daily activities?: Yes Is the patient deaf or have difficulty hearing?: Yes Does the patient have difficulty seeing, even when wearing glasses/contacts?: No Does the patient have difficulty concentrating, remembering, or making decisions?: No Patient able to express need for assistance with ADLs?: Yes Does the patient have difficulty dressing or bathing?: No Independently performs ADLs?: Yes  (appropriate for developmental age) Does the patient have difficulty walking or climbing stairs?: No Weakness of Legs: Both Weakness of Arms/Hands: Both  Permission Sought/Granted                  Emotional Assessment       Orientation: : Oriented to Self, Oriented to Place, Oriented to  Time, Oriented to Situation Alcohol / Substance Use: Not Applicable Psych Involvement: No (comment)  Admission diagnosis:  Hypoxia [R09.02] Acute hypoxemic respiratory failure due to COVID-19 ([place]) [U07.1, J96.01] COVID-19 [U07.1] Patient Active Problem List   Diagnosis Date Noted  . Myasthenia gravis ([place]) 12/03/2019  . Current chronic use of systemic steroids 12/03/2019  . Acute hypoxemic respiratory failure due to COVID-19 ([place]) 12/03/2019  . CAP (community acquired pneumonia) 12/03/2019  . 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 Chronic kidney disease, stage 2, mildly decreased GFR 07/21/2014  . Chest pain 07/20/2014  . GERD (gastroesophageal reflux disease)   . Hyperlipidemia, mixed   . Type 2 diabetes mellitus without complication, without long-term current use of insulin ([place])   . Pain in the chest   . Essential hypertension    PCP:  Leonides Sake, MD Pharmacy:   [facility], [place] [address] Phone: [phone] Fax: [place], [place] - [address] [place] [address] Phone: [phone] Fax: [phone]  [facility] [place], [place] - [address] [place] [address] Phone: [phone] Fax: [phone]  [facility] - [place], [place] [address] Phone: [phone] Fax: [phone]     Social Determinants of Health  (SDOH) Interventions    Readmission Risk Interventions No flowsheet data found.

## 2019-12-07 NOTE — Discharge Instructions (Signed)
Carbohydrate Counting For People With Diabetes  Foods with carbohydrates make your blood glucose level go up. Learning how to count carbohydrates can help you control your blood glucose levels. First, identify the foods you eat that contain carbohydrates. Then, using the Foods with Carbohydrates chart, determine about how much carbohydrates are in your meals and snacks. Make sure you are eating foods with fiber, protein, and healthy fat along with your carbohydrate foods. Foods with Carbohydrates The following table shows carbohydrate foods that have about 15 grams of carbohydrate each. Using measuring cups, spoons, or a food scale when you first begin learning about carbohydrate counting can help you learn about the portion sizes you typically eat. The following foods have 15 grams carbohydrate each:  Grains . 1 slice bread (1 ounce)  . 1 small tortilla (6-inch size)  .  large bagel (1 ounce)  . 1/3 cup pasta or rice (cooked)  .  hamburger or hot dog bun ( ounce)  .  cup cooked cereal  .  to  cup ready-to-eat cereal  . 2 taco shells (5-inch size) Fruit . 1 small fresh fruit ( to 1 cup)  .  medium banana  . 17 small grapes (3 ounces)  . 1 cup melon or berries  .  cup canned or frozen fruit  . 2 tablespoons dried fruit (blueberries, cherries, cranberries, raisins)  .  cup unsweetened fruit juice  Starchy Vegetables .  cup cooked beans, peas, corn, potatoes/sweet potatoes  .  large baked potato (3 ounces)  . 1 cup acorn or butternut squash  Snack Foods . 3 to 6 crackers  . 8 potato chips or 13 tortilla chips ( ounce to 1 ounce)  . 3 cups popped popcorn  Dairy . 3/4 cup (6 ounces) nonfat plain yogurt, or yogurt with sugar-free sweetener  . 1 cup milk  . 1 cup plain rice, soy, coconut or flavored almond milk Sweets and Desserts .  cup ice cream or frozen yogurt  . 1 tablespoon jam, jelly, pancake syrup, table sugar, or honey  . 2 tablespoons light pancake syrup  . 1 inch  square of frosted cake or 2 inch square of unfrosted cake  . 2 small cookies (2/3 ounce each) or  large cookie  Sometimes you'll have to estimate carbohydrate amounts if you don't know the exact recipe. One cup of mixed foods like soups can have 1 to 2 carbohydrate servings, while some casseroles might have 2 or more servings of carbohydrate. Foods that have less than 20 calories in each serving can be counted as "free" foods. Count 1 cup raw vegetables, or  cup cooked non-starchy vegetables as "free" foods. If you eat 3 or more servings at one meal, then count them as 1 carbohydrate serving.  Foods without Carbohydrates  Not all foods contain carbohydrates. Meat, some dairy, fats, non-starchy vegetables, and many beverages don't contain carbohydrate. So when you count carbohydrates, you can generally exclude chicken, pork, beef, fish, seafood, eggs, tofu, cheese, butter, sour cream, avocado, nuts, seeds, olives, mayonnaise, water, black coffee, unsweetened tea, and zero-calorie drinks. Vegetables with no or low carbohydrate include green beans, cauliflower, tomatoes, and onions. How much carbohydrate should I eat at each meal?  Carbohydrate counting can help you plan your meals and manage your weight. Following are some starting points for carbohydrate intake at each meal. Work with your registered dietitian nutritionist to find the best range that works for your blood glucose and weight.   To Lose Weight To  Maintain Weight  Women 2 - 3 carb servings 3 - 4 carb servings  Men 3 - 4 carb servings 4 - 5 carb servings  Checking your blood glucose after meals will help you know if you need to adjust the timing, type, or number of carbohydrate servings in your meal plan. Achieve and keep a healthy body weight by balancing your food intake and physical activity.  Tips How should I plan my meals?  Plan for half the food on your plate to include non-starchy vegetables, like salad greens, broccoli, or  carrots. Try to eat 3 to 5 servings of non-starchy vegetables every day. Have a protein food at each meal. Protein foods include chicken, fish, meat, eggs, or beans (note that beans contain carbohydrate). These two food groups (non-starchy vegetables and proteins) are low in carbohydrate. If you fill up your plate with these foods, you will eat less carbohydrate but still fill up your stomach. Try to limit your carbohydrate portion to  of the plate.  What fats are healthiest to eat?  Diabetes increases risk for heart disease. To help protect your heart, eat more healthy fats, such as olive oil, nuts, and avocado. Eat less saturated fats like butter, cream, and high-fat meats, like bacon and sausage. Avoid trans fats, which are in all foods that list "partially hydrogenated oil" as an ingredient. What should I drink?  Choose drinks that are not sweetened with sugar. The healthiest choices are water, carbonated or seltzer waters, and tea and coffee without added sugars.  Sweet drinks will make your blood glucose go up very quickly. One serving of soda or energy drink is  cup. It is best to drink these beverages only if your blood glucose is low.  Artificially sweetened, or diet drinks, typically do not increase your blood glucose if they have zero calories in them. Read labels of beverages, as some diet drinks do have carbohydrate and will raise your blood glucose. Label Reading Tips Read Nutrition Facts labels to find out how many grams of carbohydrate are in a food you want to eat. Don't forget: sometimes serving sizes on the label aren't the same as how much food you are going to eat, so you may need to calculate how much carbohydrate is in the food you are serving yourself.   Carbohydrate Counting for People with Diabetes Sample 1-Day Menu  Breakfast  cup yogurt, low fat, low sugar (1 carbohydrate serving)   cup cereal, ready-to-eat, unsweetened (1 carbohydrate serving)  1 cup strawberries (1  carbohydrate serving)   cup almonds ( carbohydrate serving)  Lunch 1, 5 ounce can chunk light tuna  2 ounces cheese, low fat cheddar  6 whole wheat crackers (1 carbohydrate serving)  1 small apple (1 carbohydrate servings)   cup carrots ( carbohydrate serving)   cup snap peas  1 cup 1% milk (1 carbohydrate serving)   Evening Meal Stir fry made with: 3 ounces chicken  1 cup brown rice (3 carbohydrate servings)   cup broccoli ( carbohydrate serving)   cup green beans   cup onions  1 tablespoon olive oil  2 tablespoons teriyaki sauce ( carbohydrate serving)  Evening Snack 1 extra small banana (1 carbohydrate serving)  1 tablespoon peanut butter   Carbohydrate Counting for People with Diabetes Vegan Sample 1-Day Menu  Breakfast 1 cup cooked oatmeal (2 carbohydrate servings)   cup blueberries (1 carbohydrate serving)  2 tablespoons flaxseeds  1 cup soymilk fortified with calcium and vitamin D  1  cup coffee  Lunch 2 slices whole wheat bread (2 carbohydrate servings)   cup baked tofu   cup lettuce  2 slices tomato  2 slices avocado   cup baby carrots ( carbohydrate serving)  1 orange (1 carbohydrate serving)  1 cup soymilk fortified with calcium and vitamin D   Evening Meal Burrito made with: 1 6-inch corn tortilla (1 carbohydrate serving)  1 cup refried vegetarian beans (2 carbohydrate servings)   cup chopped tomatoes   cup lettuce   cup salsa  1/3 cup brown rice (1 carbohydrate serving)  1 tablespoon olive oil for rice   cup zucchini   Evening Snack 6 small whole grain crackers (1 carbohydrate serving)  2 apricots ( carbohydrate serving)   cup unsalted peanuts ( carbohydrate serving)    Carbohydrate Counting for People with Diabetes Vegetarian (Lacto-Ovo) Sample 1-Day Menu  Breakfast 1 cup cooked oatmeal (2 carbohydrate servings)   cup blueberries (1 carbohydrate serving)  2 tablespoons flaxseeds  1 egg  1 cup 1% milk (1 carbohydrate serving)  1  cup coffee  Lunch 2 slices whole wheat bread (2 carbohydrate servings)  2 ounces low-fat cheese   cup lettuce  2 slices tomato  2 slices avocado   cup baby carrots ( carbohydrate serving)  1 orange (1 carbohydrate serving)  1 cup unsweetened tea  Evening Meal Burrito made with: 1 6-inch corn tortilla (1 carbohydrate serving)   cup refried vegetarian beans (1 carbohydrate serving)   cup tomatoes   cup lettuce   cup salsa  1/3 cup brown rice (1 carbohydrate serving)  1 tablespoon olive oil for rice   cup zucchini  1 cup 1% milk (1 carbohydrate serving)  Evening Snack 6 small whole grain crackers (1 carbohydrate serving)  2 apricots ( carbohydrate serving)   cup unsalted peanuts ( carbohydrate serving)    Copyright 2020  Academy [employer]. All rights reserved.  Using Nutrition Labels: Carbohydrate  . Serving Size  . Look at the serving size. All the information on the label is based on this portion. Randol Kern Per Container  . The number of servings contained in the package. . Guidelines for Carbohydrate  . Look at the total grams of carbohydrate in the serving size.  . 1 carbohydrate choice = 15 grams of carbohydrate. Range of Carbohydrate Grams Per Choice  Carbohydrate Grams/Choice Carbohydrate Choices  6-10   11-20 1  21-25 1  26-35 2  36-40 2  41-50 3  51-55 3  56-65 4  66-70 4  71-80 5    Copyright 2020  Academy [employer]. All rights reserved.  Corrin Parker, MS, RD, LDN Clinical Dietitian Office phone [phone]     Person Under Monitoring Name: Kevin Hawkins  Location: [address] [place] [id number]   Infection Prevention Recommendations for Individuals Confirmed to have, or Being Evaluated for, 2019 Novel Coronavirus (COVID-19) Infection Who Receive Care at Home  Individuals who are confirmed to have, or are being evaluated for, COVID-19 should follow the prevention steps  below until a healthcare provider or local or state health department says they can return to normal activities.  Stay home except to get medical care You should restrict activities outside your home, except for getting medical care. Do not go to work, school, or public areas, and do not use public transportation or taxis.  Call ahead before visiting your doctor Before your medical appointment, call the healthcare provider and tell them  that you have, or are being evaluated for, COVID-19 infection. This will help the healthcare provider's office take steps to keep other people from getting infected. Ask your healthcare provider to call the local or state health department.  Monitor your symptoms Seek prompt medical attention if your illness is worsening (e.g., difficulty breathing). Before going to your medical appointment, call the healthcare provider and tell them that you have, or are being evaluated for, COVID-19 infection. Ask your healthcare provider to call the local or state health department.  Wear a facemask You should wear a facemask that covers your nose and mouth when you are in the same room with other people and when you visit a healthcare provider. People who live with or visit you should also wear a facemask while they are in the same room with you.  Separate yourself from other people in your home As much as possible, you should stay in a different room from other people in your home. Also, you should use a separate bathroom, if available.  Avoid sharing household items You should not share dishes, drinking glasses, cups, eating utensils, towels, bedding, or other items with other people in your home. After using these items, you should wash them thoroughly with soap and water.  Cover your coughs and sneezes Cover your mouth and nose with a tissue when you cough or sneeze, or you can cough or sneeze into your sleeve. Throw used tissues in a lined trash can, and  immediately wash your hands with soap and water for at least 20 seconds or use an alcohol-based hand rub.  Wash your [facility] your hands often and thoroughly with soap and water for at least 20 seconds. You can use an alcohol-based hand sanitizer if soap and water are not available and if your hands are not visibly dirty. Avoid touching your eyes, nose, and mouth with unwashed hands.   Prevention Steps for Caregivers and Household Members of Individuals Confirmed to have, or Being Evaluated for, COVID-19 Infection Being Cared for in the Home  If you live with, or provide care at home for, a person confirmed to have, or being evaluated for, COVID-19 infection please follow these guidelines to prevent infection:  Follow healthcare provider's instructions Make sure that you understand and can help the patient follow any healthcare provider instructions for all care.  Provide for the patient's basic needs You should help the patient with basic needs in the home and provide support for getting groceries, prescriptions, and other personal needs.  Monitor the patient's symptoms If they are getting sicker, call his or her medical provider and tell them that the patient has, or is being evaluated for, COVID-19 infection. This will help the healthcare provider's office take steps to keep other people from getting infected. Ask the healthcare provider to call the local or state health department.  Limit the number of people who have contact with the patient  If possible, have only one caregiver for the patient.  Other household members should stay in another home or place of residence. If this is not possible, they should stay  in another room, or be separated from the patient as much as possible. Use a separate bathroom, if available.  Restrict visitors who do not have an essential need to be in the home.  Keep older adults, very young children, and other sick people away from the  patient Keep older adults, very young children, and those who have compromised immune systems or chronic health conditions  away from the patient. This includes people with chronic heart, lung, or kidney conditions, diabetes, and cancer.  Ensure good ventilation Make sure that shared spaces in the home have good air flow, such as from an air conditioner or an opened window, weather permitting.  Wash your hands often  Wash your hands often and thoroughly with soap and water for at least 20 seconds. You can use an alcohol based hand sanitizer if soap and water are not available and if your hands are not visibly dirty.  Avoid touching your eyes, nose, and mouth with unwashed hands.  Use disposable paper towels to dry your hands. If not available, use dedicated cloth towels and replace them when they become wet.  Wear a facemask and gloves  Wear a disposable facemask at all times in the room and gloves when you touch or have contact with the patient's blood, body fluids, and/or secretions or excretions, such as sweat, saliva, sputum, nasal mucus, vomit, urine, or feces.  Ensure the mask fits over your nose and mouth tightly, and do not touch it during use.  Throw out disposable facemasks and gloves after using them. Do not reuse.  Wash your hands immediately after removing your facemask and gloves.  If your personal clothing becomes contaminated, carefully remove clothing and launder. Wash your hands after handling contaminated clothing.  Place all used disposable facemasks, gloves, and other waste in a lined container before disposing them with other household waste.  Remove gloves and wash your hands immediately after handling these items.  Do not share dishes, glasses, or other household items with the patient  Avoid sharing household items. You should not share dishes, drinking glasses, cups, eating utensils, towels, bedding, or other items with a patient who is confirmed to have, or  being evaluated for, COVID-19 infection.  After the person uses these items, you should wash them thoroughly with soap and water.  Wash laundry thoroughly  Immediately remove and wash clothes or bedding that have blood, body fluids, and/or secretions or excretions, such as sweat, saliva, sputum, nasal mucus, vomit, urine, or feces, on them.  Wear gloves when handling laundry from the patient.  Read and follow directions on labels of laundry or clothing items and detergent. In general, wash and dry with the warmest temperatures recommended on the label.  Clean all areas the individual has used often  Clean all touchable surfaces, such as counters, tabletops, doorknobs, bathroom fixtures, toilets, phones, keyboards, tablets, and bedside tables, every day. Also, clean any surfaces that may have blood, body fluids, and/or secretions or excretions on them.  Wear gloves when cleaning surfaces the patient has come in contact with.  Use a diluted bleach solution (e.g., dilute bleach with 1 part bleach and 10 parts water) or a household disinfectant with a label that says EPA-registered for coronaviruses. To make a bleach solution at home, add 1 tablespoon of bleach to 1 quart (4 cups) of water. For a larger supply, add  cup of bleach to 1 gallon (16 cups) of water.  Read labels of cleaning products and follow recommendations provided on product labels. Labels contain instructions for safe and effective use of the cleaning product including precautions you should take when applying the product, such as wearing gloves or eye protection and making sure you have good ventilation during use of the product.  Remove gloves and wash hands immediately after cleaning.  Monitor yourself for signs and symptoms of illness Caregivers and household members are considered close contacts, should  monitor their health, and will be asked to limit movement outside of the home to the extent possible. Follow the  monitoring steps for close contacts listed on the symptom monitoring form.   ? If you have additional questions, contact your local health department or call the epidemiologist on call at [phone] (available 24/7). ? This guidance is subject to change. For the most up-to-date guidance from [facility], please refer to their website: [email or web address]

## 2019-12-07 NOTE — Care Management Important Message (Signed)
Important Message  Patient Details  Name: Kevin Hawkins MRN: [id number] Date of Birth: 11/04/1945   Medicare Important Message Given:  Yes S/W  PT'S WIFE SECOND IM[facility]S [facility]    Memory Argue 12/07/2019, 11:39 AM

## 2019-12-08 LAB — CULTURE, BLOOD (ROUTINE X 2)
Culture: NO GROWTH
Culture: NO GROWTH
Special Requests: ADEQUATE

## 2020-11-08 NOTE — Progress Notes (Signed)
Cardiology Office Note  Date:  11/11/2020   ID:  Gurdeep, Keesey 1946-02-11, MRN [id number]  PCP:  Leonides Sake, MD   Chief Complaint  Patient presents with   New Patient (Initial Visit)    Ref by Suzan Garibaldi, NP for chest pain and shortness of breath. Patient c/o shortness of breath with exertion, LE edema and occasional chest discomfort. Medications reviewed by the patient verbally.     HPI:  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, nephrectomy, CR  Essential hypertension Hyperlipidemia Prior smoker, quit in 1970s Who presents by referral from Suzan Garibaldi for consultation of her shortness of breath and chest pain  Wife presents with him today COVID infection in October 2021  Reports he has half a kidney Baseline creatinine 1.9  Reports having shortness of breath worse since COVID, Progression of symptoms past several months Hospitalization [facility] August 2022 Records reviewed in detail  09/2020: fall at home in bathroom Diagnosis of metabolic Encephalopathy likely 2/2 to PNA and hypertensive urgency Hospital induced delirum  S/p Unwitnessed Mechanical Fall at home with c/f Pre-Syncopal Episode    Cardiac work-up including ECHO on 06/15/20 showing: EF of 50-55%, mild AR.   echo 10/13/20 showed worsening EF with 40%.  Moderate pulmonary hypertension  Troponin 29-->42 --> 46 --> 35 felt to be demand ischemia in the setting of hypertension  -No recent ischemic work-up, likely not a good candidate given his delirium  Other notes from his hospitalization altered mental status characterized by visual hallucination and delusion.  Pneumonia, UTI, treated with antibiotics   Remote history reviewed Stress test June 2016, low risk study Echocardiogram June 2016, ejection fraction 50 to 02%, grade 2 diastolic dysfunction  EKG personally reviewed by myself on todays visit Normal sinus rhythm rate 87 bpm  left axis deviation,   PMH:   has a past medical history of ED (erectile dysfunction), GERD (gastroesophageal reflux disease), Hyperlipidemia, mixed, Hypertension, Myasthenia gravis ([place]), Renal cancer ([place]), Skin cancer, and Type II diabetes mellitus ([place]).  PSH:    Past Surgical History:  Procedure Laterality Date   APPENDECTOMY     CHOLECYSTECTOMY     NEPHRECTOMY Left ~ 2010   PANCREAS SURGERY     "I've got 2/3 of it left"   SKIN CANCER EXCISION Right    "under eye"   THYROIDECTOMY      Current Outpatient Medications  Medication Sig Dispense Refill   ALPRAZolam (XANAX) 0.25 MG tablet Take 0.25 mg by mouth 2 (two) times daily as needed.     amLODipine (NORVASC) 10 MG tablet Take 10 mg by mouth daily.     dexamethasone (DECADRON) 1 MG tablet Hold until follow up with PCP. Previous sig: Take 0.5 mg daily with breakfast.     escitalopram (LEXAPRO) 10 MG tablet Take 10 mg by mouth.     fludrocortisone (FLORINEF) 0.1 MG tablet Take 0.05 mg by mouth daily.     levocetirizine (XYZAL) 5 MG tablet Take 5 mg by mouth every evening.     levothyroxine (SYNTHROID) 112 MCG tablet Take 112 mcg by mouth daily before breakfast.     losartan (COZAAR) 100 MG tablet Take 1 tablet by mouth daily.     metFORMIN (GLUCOPHAGE) 500 MG tablet Take two 500 mg tablets in the am & 500 mg tablet in the pm.     nitroGLYCERIN (NITROSTAT) 0.4 MG SL tablet Place 1 tablet (0.4 mg total) under  the tongue every 5 (five) minutes as needed for chest pain. 30 tablet 12   ondansetron (ZOFRAN) 8 MG tablet Take 8 mg by mouth every 8 (eight) hours as needed.     pantoprazole (PROTONIX) 40 MG tablet Take 40 mg by mouth daily.     vitamin B-12 (CYANOCOBALAMIN) 100 MCG tablet Take 50 mcg by mouth daily.     atorvastatin (LIPITOR) 40 MG tablet Take 1 tablet (40 mg total) by mouth daily. (Patient not taking: Reported on 11/11/2020) 30 tablet 0   benzonatate (TESSALON) 100 MG capsule Take 100 mg by mouth 3 (three) times daily as  needed for cough.  (Patient not taking: Reported on 11/11/2020)     CABOMETYX 20 MG tablet Take 20 mg by mouth at bedtime. (Patient not taking: Reported on 11/11/2020)     fenofibrate 160 MG tablet Take 1 tablet (160 mg total) by mouth daily. (Patient not taking: Reported on 11/11/2020) 60 tablet 0   furosemide (LASIX) 20 MG tablet Take 20 mg by mouth daily. (Patient not taking: Reported on 11/11/2020)     levothyroxine (SYNTHROID) 125 MCG tablet Take 125 mcg by mouth daily. (Patient not taking: Reported on 11/11/2020)     magnesium oxide (MAG-OX) 400 (241.3 Mg) MG tablet Take 800 mg by mouth daily. (Patient not taking: Reported on 11/11/2020)     NOVOLIN R 100 UNIT/ML injection Inject 0-0.3 mLs (0-30 Units total) into the skin 3 (three) times daily as needed for high blood sugar. (Patient not taking: Reported on 11/11/2020) 10 mL 11   potassium chloride SA (K-DUR,KLOR-CON) 20 MEQ tablet Take 20 mEq by mouth daily. (Patient not taking: Reported on 11/11/2020)     predniSONE (DELTASONE) 20 MG tablet Take 20 mg by mouth daily. (Patient not taking: Reported on 11/11/2020)     No current facility-administered medications for this visit.     Allergies:   Nivolumab   Social History:  The patient  reports that he has quit smoking. His smoking use included cigarettes. He has a 40.00 pack-year smoking history. He has never used smokeless tobacco. He reports current alcohol use. He reports that he does not use drugs.   Family History:   family history includes Cancer in his father; Diabetes in his sister; Heart attack in his mother; Hypertension in his mother; Prostate cancer in his brother.    Review of Systems: Review of Systems  Constitutional: Negative.   HENT: Negative.    Respiratory: Negative.    Cardiovascular: Negative.   Gastrointestinal: Negative.   Musculoskeletal: Negative.   Neurological: Negative.   Psychiatric/Behavioral: Negative.    All other systems reviewed and are  negative.   PHYSICAL EXAM: VS:  BP 140/80 (BP Location: Right Arm, Patient Position: Sitting, Cuff Size: Normal)   Pulse 87   Ht 5\' 5"  (1.651 m)   Wt 152 lb 8 oz (69.2 kg)   SpO2 96%   BMI 25.38 kg/m  , BMI Body mass index is 25.38 kg/m. GEN: Well nourished, well developed, in no acute distress HEENT: normal Neck: no JVD, carotid bruits, or masses Cardiac: RRR; no murmurs, rubs, or gallops,no edema  Respiratory:  clear to auscultation bilaterally, normal work of breathing GI: soft, nontender, nondistended, + BS MS: no deformity or atrophy Skin: warm and dry, no rash Neuro:  Strength and sensation are intact Psych: euthymic mood, full affect  Recent Labs: 12/04/2019: TSH 1.603 12/07/2019: ALT 79; B Natriuretic Peptide 135.1; BUN 31; Creatinine, Ser 1.29; Hemoglobin 10.1; Magnesium 1.6;  Platelets 207; Potassium 3.7; Sodium 139    Lipid Panel Lab Results  Component Value Date   CHOL 169 07/21/2014   HDL 42 07/21/2014   LDLCALC 98 07/21/2014   TRIG 96 12/03/2019      Wt Readings from Last 3 Encounters:  11/11/20 152 lb 8 oz (69.2 kg)  12/03/19 175 lb (79.4 kg)  11/03/15 166 lb 11.2 oz (75.6 kg)     ASSESSMENT AND PLAN:  Problem List Items Addressed This Visit       Cardiology Problems   Essential hypertension (Chronic)   Relevant Medications   amLODipine (NORVASC) 10 MG tablet   losartan (COZAAR) 100 MG tablet   Other Relevant Orders   EKG 12-Lead   Hyperlipidemia, mixed   Relevant Medications   amLODipine (NORVASC) 10 MG tablet   losartan (COZAAR) 100 MG tablet     Other   Type 2 diabetes mellitus without complication, without long-term current use of insulin (HCC) (Chronic)   Relevant Medications   losartan (COZAAR) 100 MG tablet   metFORMIN (GLUCOPHAGE) 500 MG tablet   Other Relevant Orders   EKG 12-Lead   Metastatic Renal cell adenocarcinoma (HCC) (Chronic)   Relevant Medications   dexamethasone (DECADRON) 1 MG tablet   ondansetron (ZOFRAN) 8 MG  tablet   Acute hypoxemic respiratory failure due to COVID-19 [facility])   Chest pain - Primary   Relevant Orders   EKG 12-Lead   Shortness of breath, chest tightness Etiology unclear, depressed ejection fraction on echocardiogram at outside hospital August 2022 Unable to do cardiac CTA, would prefer not to do cardiac catheterization given half of 1 kidney remaining and creatinine 1.9 -Myoview ordered  Cardiomyopathy Notes indicating ejection fraction 40% in August 2022 Moderate pulmonary hypertension on that study Recommend he stop amlodipine, start carvedilol 6.25 twice daily Repeat echocardiogram in 1 month's time to assess ejection fraction and right heart pressures  Leg edema Stop amlodipine.  Not indicated in the setting of leg swelling and cardiomyopathy Continue torsemide 20 twice daily Medication changes as above, repeat echocardiogram  Shortness of breath Rule out ischemia as detailed above Possibly exacerbated by anemia  Anemia In the setting of renal dysfunction May need referral to nephrology, may need EPO Need iron studies.  Will defer to primary care   Total encounter time more than 60 minutes  Greater than 50% was spent in counseling and coordination of care with the patient    Signed, Esmond Plants, M.D., Ph.D. [place], [place]

## 2020-11-11 ENCOUNTER — Other Ambulatory Visit: Payer: Self-pay

## 2020-11-11 ENCOUNTER — Encounter: Payer: Self-pay | Admitting: Cardiovascular Disease

## 2020-11-11 ENCOUNTER — Ambulatory Visit: Payer: Medicare HMO | Admitting: Cardiovascular Disease

## 2020-11-11 VITALS — BP 140/80 | HR 87 | Ht 65.0 in | Wt 152.5 lb

## 2020-11-11 DIAGNOSIS — I10 Essential (primary) hypertension: Secondary | ICD-10-CM

## 2020-11-11 DIAGNOSIS — I420 Dilated cardiomyopathy: Secondary | ICD-10-CM

## 2020-11-11 DIAGNOSIS — R071 Chest pain on breathing: Secondary | ICD-10-CM

## 2020-11-11 DIAGNOSIS — E119 Type 2 diabetes mellitus without complications: Secondary | ICD-10-CM | POA: Diagnosis not present

## 2020-11-11 DIAGNOSIS — R0789 Other chest pain: Secondary | ICD-10-CM

## 2020-11-11 DIAGNOSIS — I428 Other cardiomyopathies: Secondary | ICD-10-CM

## 2020-11-11 DIAGNOSIS — I209 Angina pectoris, unspecified: Secondary | ICD-10-CM

## 2020-11-11 DIAGNOSIS — U071 COVID-19: Secondary | ICD-10-CM

## 2020-11-11 DIAGNOSIS — R0602 Shortness of breath: Secondary | ICD-10-CM

## 2020-11-11 DIAGNOSIS — J9601 Acute respiratory failure with hypoxia: Secondary | ICD-10-CM

## 2020-11-11 DIAGNOSIS — E782 Mixed hyperlipidemia: Secondary | ICD-10-CM

## 2020-11-11 DIAGNOSIS — C649 Malignant neoplasm of unspecified kidney, except renal pelvis: Secondary | ICD-10-CM

## 2020-11-11 MED ORDER — CARVEDILOL 6.25 MG PO TABS: 6.2500 mg | ORAL_TABLET | Freq: Two times a day (BID) | ORAL | 3 refills | Status: AC

## 2020-11-11 NOTE — Patient Instructions (Addendum)
Medication Instructions:   Please STOP Amlodipine  Please START coreg 6.25 mg twice a day  If you need a refill on your cardiac medications before your next appointment, please call your pharmacy.   Lab work: No new labs needed  Testing/Procedures: Leane Call (stress test)  Echo end of October 2022 Your physician has requested that you have an echocardiogram. Echocardiography is a painless test that uses sound waves to create images of your heart. It provides your doctor with information about the size and shape of your heart and how well your heart's chambers and valves are working. This procedure takes approximately one hour. There are no restrictions for this procedure.  There is a possibility that an IV may need to be started during your test to inject an image enhancing agent. This is done to obtain more optimal pictures of your heart. Therefore we ask that you do at least drink some water prior to coming in to hydrate your veins.    Follow-Up: At [facility], you and your health needs are our priority.  As part of our continuing mission to provide you with exceptional heart care, we have created designated Provider Care Teams.  These Care Teams include your primary Cardiologist (physician) and Advanced Practice Providers (APPs -  Physician Assistants and Nurse Practitioners) who all work together to provide you with the care you need, when you need it.  You will need a follow up appointment in 3 months  Providers on your designated Care Team:   Murray Hodgkins, NP Christell Faith, PA-C Marrianne Mood, PA-C Cadence Skyline, [place]  COVID-19 Vaccine Information can be found at: [email or web address].uk For questions related to vaccine distribution or appointments, please email [email or web address] or call [phone].    [facility] MYOVIEW Veterinary surgeon)  Your caregiver has ordered a Stress Test with nuclear imaging. The  purpose of this test is to evaluate the blood supply to your heart muscle. This procedure is referred to as a "Non-Invasive Stress Test." This is because other than having an IV started in your vein, nothing is inserted or "invades" your body. Cardiac stress tests are done to find areas of poor blood flow to the heart by determining the extent of coronary artery disease (CAD). Some patients exercise on a treadmill, which naturally increases the blood flow to your heart, while others who are  unable to walk on a treadmill due to physical limitations have a pharmacologic/chemical stress agent called Lexiscan . This medicine will mimic walking on a treadmill by temporarily increasing your coronary blood flow.   Please note: these test may take anywhere between 2-4 hours to complete  PLEASE REPORT TO [place] WILL DIRECT YOU WHERE TO GO  Instructions regarding medication:   _X___ : Hold diabetes medication morning of procedure Hold metformin 24 hrs prior to procedure and 48 hours after the procedure. May take metformin after the 48 hours.  Only half dose insulin at night No insulin in the morning  _X___:  Hold betablocker(s) night before procedure and morning of procedure Carvedilol  _X___:  Hold other medications as follows: torsemide  How to prepare for your Myoview test:  Do not eat or drink after midnight No caffeine for 24 hours prior to test No smoking 24 hours prior to test. ALL your medication may be taken with a few sips of water.   (Except for the meds mention above) Please wear a short sleeve shirt. Comfortable pants are appropriate. No perfume,  cologne or lotion.  PLEASE NOTIFY THE OFFICE AT LEAST 27 HOURS IN ADVANCE IF YOU ARE UNABLE TO KEEP YOUR APPOINTMENT.  [phone] AND  PLEASE NOTIFY NUCLEAR MEDICINE AT [facility] AT LEAST 24 HOURS IN ADVANCE IF YOU ARE UNABLE TO KEEP YOUR APPOINTMENT. [phone]

## 2020-11-20 ENCOUNTER — Telehealth: Payer: Self-pay | Admitting: Cardiovascular Disease

## 2020-11-20 DIAGNOSIS — R7989 Other specified abnormal findings of blood chemistry: Secondary | ICD-10-CM

## 2020-11-20 DIAGNOSIS — Z79899 Other long term (current) drug therapy: Secondary | ICD-10-CM

## 2020-11-20 DIAGNOSIS — R799 Abnormal finding of blood chemistry, unspecified: Secondary | ICD-10-CM

## 2020-11-20 NOTE — Addendum Note (Signed)
Addended by: Wynema Birch on: 11/20/2020 01:27 PM   Modules accepted: Orders

## 2020-11-20 NOTE — Telephone Encounter (Signed)
Able to reach out to Kevin Hawkins after Dr. Rockey Situ spoke to Dr. Jess Barters with oncology [facility]). Dr. Rockey Situ advised  Lab work through medical oncology showing creatinine up to 2.1  We are going to stop his torsemide  Can we call him to confirm  Would only take for significant ankle swelling, take sparingly  Also we need follow-up BMP 2 to 3 weeks time  Thx  TG   Pt verbalized understanding to stop torsemide, but keep on hand to take SPARINGLY for significant ankle swelling.   BMP schedule in office 10/21 at 1:30 pm to recheck renal levels.   [name] thankful for reaching out to him, all concerns address, not further questions at this time.  Med list updated with torsemide.

## 2020-11-20 NOTE — Telephone Encounter (Signed)
Attempted to reach out to provider with [facility] oncology, unable to make contact. Left message on Dr. Janyth Contes VM to call back regarding mutual patient in reference to labs and torsemide.

## 2020-11-20 NOTE — Telephone Encounter (Signed)
Dr. Emeterio Reeve called back Incoming call forwarded to Dr. Rockey Situ by front desk to Dr. Donivan Scull office

## 2020-11-20 NOTE — Telephone Encounter (Signed)
Kevin Hawkins from [facility] wants to discuss lab results & medication torsemide

## 2020-11-27 ENCOUNTER — Encounter
Admission: RE | Admit: 2020-11-27 | Discharge: 2020-11-27 | Disposition: A | Discharge status: Home / Self Care | Payer: Medicare HMO | Source: Ambulatory Visit | Attending: Cardiovascular Disease | Admitting: Cardiovascular Disease

## 2020-11-27 ENCOUNTER — Ambulatory Visit: Payer: Medicare HMO | Admitting: Cardiovascular Disease

## 2020-11-27 ENCOUNTER — Other Ambulatory Visit: Payer: Self-pay

## 2020-11-27 DIAGNOSIS — E119 Type 2 diabetes mellitus without complications: Secondary | ICD-10-CM | POA: Diagnosis not present

## 2020-11-27 DIAGNOSIS — I420 Dilated cardiomyopathy: Secondary | ICD-10-CM | POA: Diagnosis not present

## 2020-11-27 DIAGNOSIS — I209 Angina pectoris, unspecified: Secondary | ICD-10-CM | POA: Diagnosis not present

## 2020-11-27 DIAGNOSIS — R0602 Shortness of breath: Secondary | ICD-10-CM | POA: Diagnosis not present

## 2020-11-27 LAB — NM MYOCAR MULTI W/SPECT W/WALL MOTION / EF
Estimated workload: 1
Exercise duration (min): 0 min
Exercise duration (sec): 0 s
LV dias vol: 88 mL (ref 62–150)
LV sys vol: 45 mL
MPHR: 147 {beats}/min
Nuc Stress EF: 49 %
Peak HR: 100 {beats}/min
Percent HR: 68 %
Rest HR: 71 {beats}/min
Rest Nuclear Isotope Dose: 11 mCi
SDS: 0
SRS: 1
SSS: 1
ST Depression (mm): 0 mm
Stress Nuclear Isotope Dose: 31.4 mCi
TID: 1

## 2020-11-27 MED ORDER — TECHNETIUM TC 99M TETROFOSMIN IV KIT
10.0000 | PACK | Freq: Once | INTRAVENOUS | Status: AC | PRN
  Administered 2020-11-27: 11 via INTRAVENOUS

## 2020-11-27 MED ORDER — REGADENOSON 0.4 MG/5ML IV SOLN
0.4000 mg | Freq: Once | INTRAVENOUS | Status: AC
  Administered 2020-11-27: 0.4 mg via INTRAVENOUS

## 2020-11-27 MED ORDER — TECHNETIUM TC 99M TETROFOSMIN IV KIT
31.4200 | PACK | Freq: Once | INTRAVENOUS | Status: AC | PRN
  Administered 2020-11-27: 31.42 via INTRAVENOUS

## 2020-12-02 ENCOUNTER — Telehealth: Payer: Self-pay

## 2020-12-02 NOTE — Telephone Encounter (Signed)
Able to reach pt regarding his recent Lexiscan Dr. Rockey Situ had a chance to review his results and advised   "Stress test  No ischemia, low risk study, no coronary calcification  Mildly depressed ejection fraction 45% but could be from artifact, appears echocardiogram is pending to correlate "  [name] very thankful for the phone call of his results, all questions and concerns were address with nothing further at this time. Will see at next schedule f/u appt.   ECHO 12/28/2020 at 09:30 am F/U Dr. Rockey Situ 12/20

## 2020-12-06 ENCOUNTER — Other Ambulatory Visit: Payer: Self-pay

## 2020-12-06 ENCOUNTER — Other Ambulatory Visit (INDEPENDENT_AMBULATORY_CARE_PROVIDER_SITE_OTHER): Payer: Medicare HMO

## 2020-12-06 DIAGNOSIS — R7989 Other specified abnormal findings of blood chemistry: Secondary | ICD-10-CM

## 2020-12-06 DIAGNOSIS — R799 Abnormal finding of blood chemistry, unspecified: Secondary | ICD-10-CM

## 2020-12-06 DIAGNOSIS — Z79899 Other long term (current) drug therapy: Secondary | ICD-10-CM

## 2020-12-06 NOTE — Patient Instructions (Signed)
BMP with 1 SST

## 2020-12-07 LAB — BASIC METABOLIC PANEL
BUN/Creatinine Ratio: 13 (ref 10–24)
BUN: 21 mg/dL (ref 8–27)
CO2: 24 mmol/L (ref 20–29)
Calcium: 8.9 mg/dL (ref 8.6–10.2)
Chloride: 99 mmol/L (ref 96–106)
Creatinine, Ser: 1.59 mg/dL — ABNORMAL HIGH (ref 0.76–1.27)
Glucose: 172 mg/dL — ABNORMAL HIGH (ref 70–99)
Potassium: 4.8 mmol/L (ref 3.5–5.2)
Sodium: 141 mmol/L (ref 134–144)
eGFR: 46 mL/min/{1.73_m2} — ABNORMAL LOW (ref >59)

## 2020-12-16 ENCOUNTER — Telehealth: Payer: Self-pay

## 2020-12-16 NOTE — Telephone Encounter (Signed)
Attempted to call pt, line ring busy x2. Will try to reach out at a later time

## 2020-12-18 ENCOUNTER — Telehealth: Payer: Self-pay

## 2020-12-18 ENCOUNTER — Other Ambulatory Visit: Payer: Medicare HMO

## 2020-12-18 NOTE — Telephone Encounter (Signed)
Able to reach pt's wife Enid Derry (DPR approved), pt is asleep on cough (under the weather), regarding Mr. Flannagan recent lab work, Dr. Rockey Situ had a chance to review their results and advised   "BMP  Renal function high end of his range  Sugars little bit high  Otherwise stable numbers "  All questions or concerns were address and no additional concerns at this time. Agreeable to plan, will call back for anything further.    Keep ECHO for 11/4, if not feeling well by then, may call to reschedule, have done before 12/20 appt with Dr. Rockey Situ for 3 m F/U

## 2020-12-20 ENCOUNTER — Ambulatory Visit (INDEPENDENT_AMBULATORY_CARE_PROVIDER_SITE_OTHER): Payer: Medicare HMO

## 2020-12-20 ENCOUNTER — Other Ambulatory Visit: Payer: Self-pay

## 2020-12-20 DIAGNOSIS — I209 Angina pectoris, unspecified: Secondary | ICD-10-CM

## 2020-12-20 DIAGNOSIS — R0602 Shortness of breath: Secondary | ICD-10-CM

## 2020-12-20 DIAGNOSIS — I420 Dilated cardiomyopathy: Secondary | ICD-10-CM | POA: Diagnosis not present

## 2020-12-20 LAB — ECHOCARDIOGRAM COMPLETE
AR max vel: 2.69 cm2
AV Area VTI: 2.83 cm2
AV Area mean vel: 2.57 cm2
AV Mean grad: 3 mmHg
AV Peak grad: 6 mmHg
Ao pk vel: 1.22 m/s
Area-P 1/2: 4.1 cm2
Calc EF: 42.9 %
S' Lateral: 4.4 cm
Single Plane A2C EF: 44.7 %
Single Plane A4C EF: 42.3 %

## 2020-12-23 ENCOUNTER — Telehealth: Payer: Self-pay | Admitting: Cardiovascular Disease

## 2020-12-23 NOTE — Telephone Encounter (Signed)
Was able to reach back out to Kevin Hawkins to f/u on his symptoms. Reports over past two months has been feeling, weak, swimmy headed, dizzy, and sick" Wife and pt unsure if he passed out a few days ago.   Pt reports feels okay right now, unable to report any BP/HR numbers.  While on phone was able to review pt's recent ECHO results.  Echo  Ejection fraction around the same as seen previously, 40 to 45%  Right heart function and pressures normal  No significant valve disease  Would stay on current medications for now   Stress test with no significant ischemia, also does not have significant coronary calcification   Attempted to schedule pt sooner then Dec appt with Dr. Rockey Situ, several openings this week with Cadence Kathlen Mody, PA-C, however, pt is unsure of other appts he has this week and reports will call back for a sooner appt if his symptoms persist. Also, advised pt to stay hydrated and attempt to check his BP/HR during times of lightheadedness, dizzy, or feeling like he may pass out. Pt verbalized understanding.   Can also seek ED for symptoms for further work-up as stress test and ECHO did not show significance reason for his symptoms or call the office for sooner appt once he knows his schedule with other appts. Otherwise all questions were address and no additional concerns at this time. Agreeable to plan, will call back for anything further.  Kevin Hawkins is thankful for the return call of his results and symptoms.

## 2020-12-23 NOTE — Telephone Encounter (Signed)
PCP is calling states patient is experiencing some SOB (patient's wife called their office) Pt c/o Shortness Of Breath: STAT if SOB developed within the last 24 hours or pt is noticeably SOB on the phone  1. Are you currently SOB (can you hear that pt is SOB on the phone)? Not sure  2. How long have you been experiencing SOB? 2 months, feels like it is getting worse  3. Are you SOB when sitting or when up moving around? Not sure  4. Are you currently experiencing any other symptoms? Passed out on Thursday t( wife thinks)  Pt c/o Syncope: STAT if syncope occurred within 30 minutes and pt complains of lightheadedness High Priority if episode of passing out, completely, today or in last 24 hours   Did you pass out today? no  When is the last time you passed out? Yes, this is the 1st time  Has this occurred multiple times? no  Did you have any symptoms prior to passing out? Feeling dizzy, swimmy headed,felt sick.

## 2021-02-04 ENCOUNTER — Ambulatory Visit: Payer: Medicare HMO | Admitting: Cardiovascular Disease

## 2021-02-05 ENCOUNTER — Encounter: Payer: Self-pay | Admitting: Cardiovascular Disease

## 2021-02-18 ENCOUNTER — Telehealth: Payer: Self-pay | Admitting: Cardiovascular Disease

## 2021-02-18 NOTE — Telephone Encounter (Signed)
Patient's PCP called  Pt c/o Shortness Of Breath: STAT if SOB developed within the last 24 hours or pt is noticeably SOB on the phone  1. Are you currently SOB (can you hear that pt is SOB on the phone)? Not this morning  2. How long have you been experiencing SOB? 2 months  3. Are you SOB when sitting or when up moving around? Up moving around  4. Are you currently experiencing any other symptoms?  PCP states patient's "color is not good".Patient cannot lay down, has to sleep in recliner.  Patient is scheduled for tomorrow with Dr. Rockey Situ

## 2021-02-19 ENCOUNTER — Other Ambulatory Visit: Payer: Self-pay

## 2021-02-19 ENCOUNTER — Encounter: Payer: Self-pay | Admitting: Cardiovascular Disease

## 2021-02-19 ENCOUNTER — Ambulatory Visit (INDEPENDENT_AMBULATORY_CARE_PROVIDER_SITE_OTHER): Payer: Medicare HMO | Admitting: Cardiovascular Disease

## 2021-02-19 VITALS — BP 100/58 | HR 77 | Ht 65.0 in | Wt 149.0 lb

## 2021-02-19 DIAGNOSIS — I10 Essential (primary) hypertension: Secondary | ICD-10-CM

## 2021-02-19 DIAGNOSIS — I420 Dilated cardiomyopathy: Secondary | ICD-10-CM | POA: Diagnosis not present

## 2021-02-19 DIAGNOSIS — E119 Type 2 diabetes mellitus without complications: Secondary | ICD-10-CM

## 2021-02-19 DIAGNOSIS — E782 Mixed hyperlipidemia: Secondary | ICD-10-CM | POA: Diagnosis not present

## 2021-02-19 DIAGNOSIS — C649 Malignant neoplasm of unspecified kidney, except renal pelvis: Secondary | ICD-10-CM

## 2021-02-19 NOTE — Patient Instructions (Addendum)
Medication Instructions:  Please STOP the fludrocortisone  Please try OTC Iron pill, B12 supplements, and boost/ensure drinks  If you need a refill on your cardiac medications before your next appointment, please call your pharmacy.   Lab work: No new labs needed  Testing/Procedures: No new testing needed  Follow-Up: At [facility], you and your health needs are our priority.  As part of our continuing mission to provide you with exceptional heart care, we have created designated Provider Care Teams.  These Care Teams include your primary Cardiologist (physician) and Advanced Practice Providers (APPs -  Physician Assistants and Nurse Practitioners) who all work together to provide you with the care you need, when you need it.  You will need a follow up appointment in 6 months  Providers on your designated Care Team:   Murray Hodgkins, NP Christell Faith, PA-C Cadence Kathlen Mody, [place]  COVID-19 Vaccine Information can be found at: [email or web address].uk For questions related to vaccine distribution or appointments, please email [email or web address] or call [phone].

## 2021-02-19 NOTE — Progress Notes (Signed)
Cardiology Office Note  Date:  02/19/2021   ID:  Kevin, Hawkins 1945/07/04, MRN [id number]  PCP:  System, Provider Not In   Chief Complaint  Patient presents with   3 month follow up     Patient c/o LE edema & shortness of breath. Medications reviewed by the patient verbally.     HPI:  [name] is a 76 year old gentleman with past medical history of Renal cell carcinoma on left, nephrectomy,  Chronic renal insufficiency Essential hypertension Hyperlipidemia Prior smoker, quit in 1970s Pancreas cancer, metastatic Who presents for f/u of his shortness of breath and chest pain, cardiomyopathy ejection fraction 45%  Recent imaging studies reviewed on today's visit Stress test October 2022   No ST deviation was noted.   LV perfusion is normal. There is no evidence of ischemia. There is no evidence of infarction.   Left ventricular function is normal visually although calculated EF is 45%. correlation with echo advised.   There is no coronary artery calcifications   This is a low risk study.   Wife presents with him today Long discussion concerning morphologic issues, metastatic Nervous concerning initiation of new therapies Hospice at this time Anemic hemoglobin 8.5 Has worsening leg swelling, scrotal edema Appetite poor, missing meals, low carb intake She is not having any more of the time to eat more  Presents today in a wheelchair  Denies any chest pain concerning for angina EKG personally reviewed by myself on todays visit Normal sinus rhythm rate 77 bpm PVCs poor R wave progression  Past medical history reviewed Reports having shortness of breath worse since COVID, Progression of symptoms past several months Hospitalization [facility] August 2022 Records reviewed in detail  09/2020: fall at home in bathroom Diagnosis of metabolic Encephalopathy likely 2/2 to PNA and hypertensive urgency Hospital induced delirum  S/p Unwitnessed Mechanical Fall at  home with c/f Pre-Syncopal Episode    Cardiac work-up including ECHO on 06/15/20 showing: EF of 50-55%, mild AR.   echo 10/13/20 showed worsening EF with 40%.  Moderate pulmonary hypertension  Troponin 29-->42 --> 46 --> 35 felt to be demand ischemia in the setting of hypertension  -No recent ischemic work-up, likely not a good candidate given his delirium  Other notes from his hospitalization altered mental status characterized by visual hallucination and delusion.  Pneumonia, UTI, treated with antibiotics   Remote history reviewed Stress test June 2016, low risk study Echocardiogram June 2016, ejection fraction 50 to 60%, grade 2 diastolic dysfunction  PMH:   has a past medical history of ED (erectile dysfunction), GERD (gastroesophageal reflux disease), Hyperlipidemia, mixed, Hypertension, Myasthenia gravis ([place]), Renal cancer ([place]), Skin cancer, and Type II diabetes mellitus ([place]).  PSH:    Past Surgical History:  Procedure Laterality Date   APPENDECTOMY     CHOLECYSTECTOMY     NEPHRECTOMY Left ~ 2010   PANCREAS SURGERY     "I've got 2/3 of it left"   SKIN CANCER EXCISION Right    "under eye"   THYROIDECTOMY      Current Outpatient Medications  Medication Sig Dispense Refill   ALPRAZolam (XANAX) 0.25 MG tablet Take 0.25 mg by mouth 2 (two) times daily as needed.     carvedilol (COREG) 6.25 MG tablet Take 1 tablet (6.25 mg total) by mouth 2 (two) times daily. 180 tablet 3   dexamethasone (DECADRON) 1 MG tablet Hold until follow up with PCP. Previous sig: Take 0.5 mg daily with breakfast.     escitalopram (  LEXAPRO) 10 MG tablet Take 10 mg by mouth.     levocetirizine (XYZAL) 5 MG tablet Take 5 mg by mouth every evening.     levothyroxine (SYNTHROID) 112 MCG tablet Take 112 mcg by mouth daily before breakfast.     losartan (COZAAR) 100 MG tablet Take 1 tablet by mouth daily.     metFORMIN (GLUCOPHAGE) 500 MG tablet Take two 500 mg tablets in the am & 500 mg tablet in the pm.      nitroGLYCERIN (NITROSTAT) 0.4 MG SL tablet Place 1 tablet (0.4 mg total) under the tongue every 5 (five) minutes as needed for chest pain. 30 tablet 12   ondansetron (ZOFRAN) 8 MG tablet Take 8 mg by mouth every 8 (eight) hours as needed.     pantoprazole (PROTONIX) 40 MG tablet Take 40 mg by mouth daily.     torsemide (DEMADEX) 20 MG tablet Take 20 mg by mouth as needed. Take SPARINGLY for ankle swelling 180 tablet 3   vitamin B-12 (CYANOCOBALAMIN) 100 MCG tablet Take 50 mcg by mouth daily.     atorvastatin (LIPITOR) 40 MG tablet Take 1 tablet (40 mg total) by mouth daily. (Patient not taking: Reported on 11/11/2020) 30 tablet 0   benzonatate (TESSALON) 100 MG capsule Take 100 mg by mouth 3 (three) times daily as needed for cough.  (Patient not taking: Reported on 11/11/2020)     CABOMETYX 20 MG tablet Take 20 mg by mouth at bedtime. (Patient not taking: Reported on 11/11/2020)     fenofibrate 160 MG tablet Take 1 tablet (160 mg total) by mouth daily. (Patient not taking: Reported on 11/11/2020) 60 tablet 0   levothyroxine (SYNTHROID) 125 MCG tablet Take 125 mcg by mouth daily. (Patient not taking: Reported on 11/11/2020)     magnesium oxide (MAG-OX) 400 (241.3 Mg) MG tablet Take 800 mg by mouth daily. (Patient not taking: Reported on 11/11/2020)     NOVOLIN R 100 UNIT/ML injection Inject 0-0.3 mLs (0-30 Units total) into the skin 3 (three) times daily as needed for high blood sugar. (Patient not taking: Reported on 11/11/2020) 10 mL 11   potassium chloride SA (K-DUR,KLOR-CON) 20 MEQ tablet Take 20 mEq by mouth daily. (Patient not taking: Reported on 02/19/2021)     predniSONE (DELTASONE) 20 MG tablet Take 20 mg by mouth daily. (Patient not taking: Reported on 11/11/2020)     No current facility-administered medications for this visit.     Allergies:   Nivolumab   Social History:  The patient  reports that he has quit smoking. His smoking use included cigarettes. He has a 40.00 pack-year smoking  history. He has never used smokeless tobacco. He reports current alcohol use. He reports that he does not use drugs.   Family History:   family history includes Cancer in his father; Diabetes in his sister; Heart attack in his mother; Hypertension in his mother; Prostate cancer in his brother.    Review of Systems: Review of Systems  Constitutional: Negative.   HENT: Negative.    Respiratory: Negative.    Cardiovascular: Negative.   Gastrointestinal: Negative.   Musculoskeletal: Negative.   Neurological: Negative.   Psychiatric/Behavioral: Negative.    All other systems reviewed and are negative.   PHYSICAL EXAM: VS:  BP (!) 100/58 (BP Location: Left Arm, Patient Position: Sitting, Cuff Size: Normal)    Pulse 77    Ht 5\' 5"  (1.651 m)    Wt 149 lb (67.6 kg)    SpO2 96%  BMI 24.79 kg/m  , BMI Body mass index is 24.79 kg/m. Constitutional:  oriented to person, place, and time. No distress.  HENT:  Head: Grossly normal Eyes:  no discharge. No scleral icterus.  Neck: No JVD, no carotid bruits  Cardiovascular: Regular rate and rhythm, no murmurs appreciated Pulmonary/Chest: Clear to auscultation bilaterally, no wheezes or rails Abdominal: Soft.  no distension.  no tenderness.  Musculoskeletal: Normal range of motion Neurological:  normal muscle tone. Coordination normal. No atrophy Skin: Skin warm and dry Psychiatric: normal affect, pleasant  Recent Labs: 12/06/2020: BUN 21; Creatinine, Ser 1.59; Potassium 4.8; Sodium 141    Lipid Panel Lab Results  Component Value Date   CHOL 169 07/21/2014   HDL 42 07/21/2014   LDLCALC 98 07/21/2014   TRIG 96 12/03/2019      Wt Readings from Last 3 Encounters:  02/19/21 149 lb (67.6 kg)  11/11/20 152 lb 8 oz (69.2 kg)  12/03/19 175 lb (79.4 kg)     ASSESSMENT AND PLAN:  Problem List Items Addressed This Visit       Cardiology Problems   Essential hypertension (Chronic)   Hyperlipidemia, mixed     Other   Type 2 diabetes  mellitus without complication, without long-term current use of insulin (HCC) (Chronic)   Metastatic Renal cell adenocarcinoma (HCC) (Chronic)   Other Visit Diagnoses     Dilated cardiomyopathy ([place])    -  Primary     Shortness of breath,  Chronic issue exacerbated by anemia Mildly depressed ejection fraction, stress test with no ischemia -Stressed importance of eating, getting his hemoglobin Consider iron, B12  Cardiomyopathy Notes indicating ejection fraction 40% in August 2022 Appears nonischemic by stress testing Repeat study ejection fraction 40 to 45% No indication of high right-sided pressures  Leg edema Avoiding calcium channel blockers Continue torsemide 20 twice daily Exacerbated by anemia, poor nutrition  Shortness of breath Rule out ischemia as detailed above Possibly exacerbated by anemia Deconditioned  Anemia Consider iron, EPO, B12 Better nutrition discussed   Total encounter time more than 25 minutes  Greater than 50% was spent in counseling and coordination of care with the patient    Signed, Esmond Plants, M.D., Ph.D. [place], [place]

## 2021-02-20 NOTE — Addendum Note (Signed)
Addended by: Anselm Pancoast on: 02/20/2021 01:11 PM   Modules accepted: Orders

## 2021-03-18 ENCOUNTER — Ambulatory Visit: Payer: Medicare HMO | Admitting: Cardiovascular Disease

## 2021-03-19 DEATH — deceased

## 2022-09-27 IMAGING — DX DG CHEST 1V PORT
1 series · 1 of 1 positions shown · non-contrast
Comparison: December 21, 2018

CLINICAL DATA: Hypoxia.  Reported [id number] positive

EXAM:
PORTABLE CHEST 1 VIEW

[chest ap]
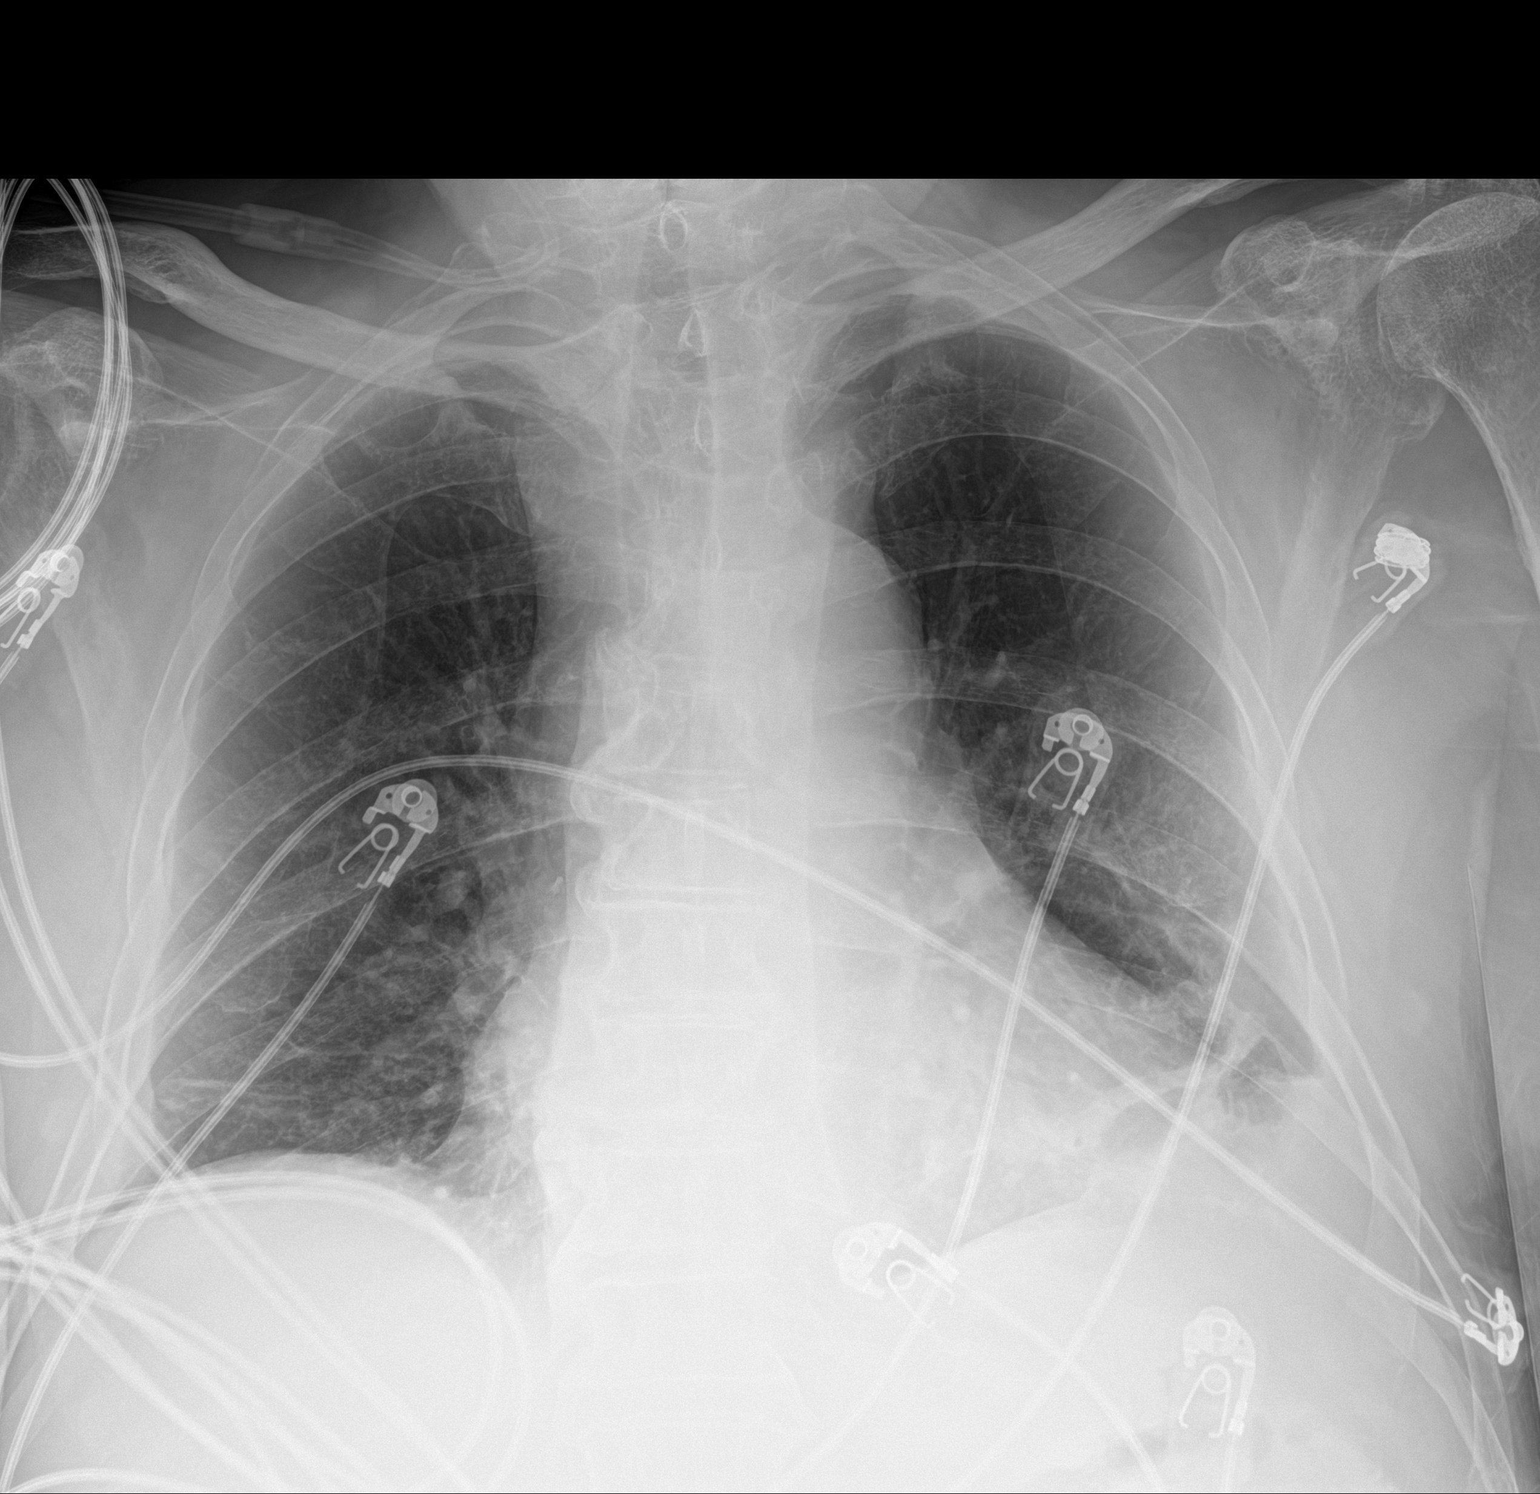

[1 of 1 positions shown; findings below may reference images not displayed]

FINDINGS: There is scarring in the left base with questionable small left
effusion. There is also mild scarring in the right base. There is
ill-defined airspace opacity in the left lower lung region. Heart is
upper normal in size with pulmonary vascularity normal. No
adenopathy. No bone lesions.
IMPRESSION: Ill-defined airspace opacity left lower lobe consistent with
pneumonia. Suspect atypical organism pneumonia given this
appearance.

Scarring in the lung bases with questionable scarring versus small
pleural effusion lateral left base.

Stable cardiac silhouette.
